# Patient Record
Sex: Male | Born: 1955 | Race: Black or African American | Hispanic: No | Marital: Single | Smoking: Former smoker
Health system: Southern US, Community
[De-identification: ages and names within clinical notes are randomized; demographics above are authoritative.]

## PROBLEM LIST (undated history)

## (undated) DIAGNOSIS — C61 Malignant neoplasm of prostate: Secondary | ICD-10-CM

## (undated) DIAGNOSIS — E7800 Pure hypercholesterolemia, unspecified: Secondary | ICD-10-CM

## (undated) DIAGNOSIS — I10 Essential (primary) hypertension: Secondary | ICD-10-CM

---

## 2019-07-17 DIAGNOSIS — U071 COVID-19: Secondary | ICD-10-CM

## 2019-07-17 HISTORY — DX: COVID-19: U07.1

## 2020-02-23 ENCOUNTER — Encounter: Payer: Self-pay | Admitting: *Deleted

## 2020-04-08 ENCOUNTER — Ambulatory Visit: Payer: Self-pay

## 2020-04-26 ENCOUNTER — Other Ambulatory Visit: Payer: Self-pay

## 2020-04-26 ENCOUNTER — Ambulatory Visit (INDEPENDENT_AMBULATORY_CARE_PROVIDER_SITE_OTHER): Payer: Self-pay | Admitting: *Deleted

## 2020-04-26 VITALS — Ht 67.0 in | Wt 174.0 lb

## 2020-04-26 DIAGNOSIS — Z86010 Personal history of colonic polyps: Secondary | ICD-10-CM

## 2020-04-26 DIAGNOSIS — Z1211 Encounter for screening for malignant neoplasm of colon: Secondary | ICD-10-CM

## 2020-04-26 MED ORDER — PEG 3350-KCL-NA BICARB-NACL 420 G PO SOLR: 4000.0000 mL | Freq: Once | ORAL | 0 refills | Status: AC

## 2020-04-26 NOTE — Progress Notes (Addendum)
Gastroenterology Pre-Procedure Review  Request Date: 04/26/2020 Requesting Physician: Dr. Sherrie Sport, Last TCS 06/28/2011 done by Dr. Britta Mccreedy, tubular adenoma, family hx of colon cancer (father)  PATIENT REVIEW QUESTIONS: The patient responded to the following health history questions as indicated:    1. Diabetes Melitis: no 2. Joint replacements in the past 12 months: no 3. Major health problems in the past 3 months: no 4. Has an artificial valve or MVP: no 5. Has a defibrillator: no 6. Has been advised in past to take antibiotics in advance of a procedure like teeth cleaning: no 7. Family history of colon cancer: yes, father: age 76  8. Alcohol Use: no 9. Illicit drug Use: no 10. History of sleep apnea: no  11. History of coronary artery or other vascular stents placed within the last 12 months: no 12. History of any prior anesthesia complications: no 13. Body mass index is 27.25 kg/m.    MEDICATIONS & ALLERGIES:    Patient reports the following regarding taking any blood thinners:   Plavix? no Aspirin? no Coumadin? no Brilinta? no Xarelto? no Eliquis? no Pradaxa? no Savaysa? no Effient? no  Patient confirms/reports the following medications:  Current Outpatient Medications  Medication Sig Dispense Refill  . acetaminophen (TYLENOL) 500 MG tablet Take 500 mg by mouth as needed. Takes 2 tablets as needed.    Marland Kitchen amLODipine (NORVASC) 10 MG tablet Take by mouth daily.    Marland Kitchen atorvastatin (LIPITOR) 40 MG tablet Take by mouth daily.    . montelukast (SINGULAIR) 10 MG tablet Take 10 mg by mouth daily.    . Multiple Vitamins-Minerals (ONE-A-DAY MENS 50+ PO) Take by mouth daily.    . Omega-3 Fatty Acids (FISH OIL) 1200 MG CAPS Take by mouth in the morning and at bedtime.     No current facility-administered medications for this visit.    Patient confirms/reports the following allergies:  Not on File  No orders of the defined types were placed in this encounter.   AUTHORIZATION  INFORMATION Primary Insurance: [place],  [place] #:[id number] ,  Group #: [id number] Pre-Cert / Auth required: No, file to local BCBS  SCHEDULE INFORMATION: Procedure has been scheduled as follows:  Date: 05/24/2020, Time: 10:30 Location: [facility] with Dr. Abbey Chatters  This Gastroenterology Pre-Precedure Review Form is being routed to the following provider(s): Aliene Altes, PA

## 2020-04-26 NOTE — Patient Instructions (Signed)
Logan Lowery   1955-12-25 MRN: [id number]    Procedure Date: 05/24/2020 Time to register: 9:00 am Place to register: [facility] Procedure Time: 10:30 am Scheduled provider: Dr. Abbey Chatters  PREPARATION FOR COLONOSCOPY WITH TRI-LYTE SPLIT PREP  Please notify us immediately if you are diabetic, take iron supplements, or if you are on Coumadin or any other blood thinners.   Please hold the following medications: n/a  You will need to purchase 1 fleet enema and 1 box of Bisacodyl 21m tablets.   2 DAYS BEFORE PROCEDURE:  DATE: 05/22/2020   DAY: Saturday Begin clear liquid diet AFTER your lunch meal. NO SOLID FOODS after this point.  1 DAY BEFORE PROCEDURE:  DATE: 05/23/2020   DAY: Sunday Continue clear liquids the entire day - NO SOLID FOOD.   Diabetic medications adjustments for today: n/a  At 2:00 pm:  Take 2 Bisacodyl tablets.   At 4:00pm:  Start drinking your solution. Make sure you mix well per instructions on the bottle. Try to drink 1 (one) 8 ounce glass every 10-15 minutes until you have consumed HALF the jug. You should complete by 6:00pm.You must keep the left over solution refrigerated until completed next day.  Continue clear liquids. You must drink plenty of clear liquids to prevent dehyration and kidney failure.     DAY OF PROCEDURE:   DATE: 05/24/2020   DAY: Monday If you take medications for your heart, blood pressure or breathing, you may take these medications.  Diabetic medications adjustments for today: n/a  Five hours before your procedure time @ 5:30 am:  Finish remaining amout of bowel prep, drinking 1 (one) 8 ounce glass every 10-15 minutes until complete. You have two hours to consume remaining prep.   Three hours before your procedure time @ 7:30 am:  Nothing by mouth.   At least one hour before going to the hospital:  Give yourself one Fleet enema. You may take your morning medications with sip of water unless we have instructed otherwise.       Please see below for Dietary Information.  CLEAR LIQUIDS INCLUDE:  Water Jello (NOT red in color)   Ice Popsicles (NOT red in color)   Tea (sugar ok, no milk/cream) Powdered fruit flavored drinks  Coffee (sugar ok, no milk/cream) Gatorade/ Lemonade/ Kool-Aid  (NOT red in color)   Juice: apple, white grape, white cranberry Soft drinks  Clear bullion, consomme, broth (fat free beef/chicken/vegetable)  Carbonated beverages (any kind)  Strained chicken noodle soup Hard Candy   Remember: Clear liquids are liquids that will allow you to see your fingers on the other side of a clear glass. Be sure liquids are NOT red in color, and not cloudy, but CLEAR.  DO NOT EAT OR DRINK ANY OF THE FOLLOWING:  Dairy products of any kind   Cranberry juice Tomato juice / V8 juice   Grapefruit juice Orange juice     Red grape juice  Do not eat any solid foods, including such foods as: cereal, oatmeal, yogurt, fruits, vegetables, creamed soups, eggs, bread, crackers, pureed foods in a blender, etc.   HELPFUL HINTS FOR DRINKING PREP SOLUTION:   Make sure prep is extremely cold. Mix and refrigerate the the morning of the prep. You may also put in the freezer.   You may try mixing some Crystal Light or Country Time Lemonade if you prefer. Mix in small amounts; add more if necessary.  Try drinking through a straw  Rinse mouth with water or  a mouthwash between glasses, to remove after-taste.  Try sipping on a cold beverage /ice/ popsicles between glasses of prep.  Place a piece of sugar-free hard candy in mouth between glasses.  If you become nauseated, try consuming smaller amounts, or stretch out the time between glasses. Stop for 30-60 minutes, then slowly start back drinking.        OTHER INSTRUCTIONS  You will need a responsible adult at least 64 years of age to accompany you and drive you home. This person must remain in the waiting room during your procedure. The hospital will cancel  your procedure if you do not have a responsible adult with you.   1. Wear loose fitting clothing that is easily removed. 2. Leave jewelry and other valuables at home.  3. Remove all body piercing jewelry and leave at home. 4. Total time from sign-in until discharge is approximately 2-3 hours. 5. You should go home directly after your procedure and rest. You can resume normal activities the day after your procedure. 6. The day of your procedure you should not:  Drive  Make legal decisions  Operate machinery  Drink alcohol  Return to work   You may call the office (Dept: [phone]) before 5:00pm, or page the doctor on call [phone]) after 5:00pm, for further instructions, if necessary.   Insurance Information YOU WILL NEED TO CHECK WITH YOUR INSURANCE COMPANY FOR THE BENEFITS OF COVERAGE YOU HAVE FOR THIS PROCEDURE.  UNFORTUNATELY, NOT ALL INSURANCE COMPANIES HAVE BENEFITS TO COVER ALL OR PART OF THESE TYPES OF PROCEDURES.  IT IS YOUR RESPONSIBILITY TO CHECK YOUR BENEFITS, HOWEVER, WE WILL BE GLAD TO ASSIST YOU WITH ANY CODES YOUR INSURANCE COMPANY MAY NEED.    PLEASE NOTE THAT MOST INSURANCE COMPANIES WILL NOT COVER A SCREENING COLONOSCOPY FOR PEOPLE UNDER THE AGE OF 50  IF YOU HAVE BCBS INSURANCE, YOU MAY HAVE BENEFITS FOR A SCREENING COLONOSCOPY BUT IF POLYPS ARE FOUND THE DIAGNOSIS WILL CHANGE AND THEN YOU MAY HAVE A DEDUCTIBLE THAT WILL NEED TO BE MET. SO PLEASE MAKE SURE YOU CHECK YOUR BENEFITS FOR A SCREENING COLONOSCOPY AS WELL AS A DIAGNOSTIC COLONOSCOPY.

## 2020-04-28 NOTE — Progress Notes (Signed)
Ok to schedule.  ASA II 

## 2020-05-20 ENCOUNTER — Encounter (HOSPITAL_COMMUNITY): Payer: Self-pay | Admitting: *Deleted

## 2020-05-21 ENCOUNTER — Other Ambulatory Visit (HOSPITAL_COMMUNITY)
Admission: RE | Admit: 2020-05-21 | Discharge: 2020-05-21 | Disposition: A | Discharge status: Home / Self Care | Payer: BC Managed Care – PPO | Source: Ambulatory Visit | Attending: Internal Medicine | Admitting: Internal Medicine

## 2020-05-21 ENCOUNTER — Other Ambulatory Visit: Payer: Self-pay

## 2020-05-21 DIAGNOSIS — Z20822 Contact with and (suspected) exposure to covid-19: Secondary | ICD-10-CM | POA: Diagnosis not present

## 2020-05-21 DIAGNOSIS — K648 Other hemorrhoids: Secondary | ICD-10-CM | POA: Diagnosis not present

## 2020-05-21 DIAGNOSIS — Z01812 Encounter for preprocedural laboratory examination: Secondary | ICD-10-CM | POA: Insufficient documentation

## 2020-05-21 DIAGNOSIS — Z1211 Encounter for screening for malignant neoplasm of colon: Secondary | ICD-10-CM | POA: Diagnosis not present

## 2020-05-21 DIAGNOSIS — D123 Benign neoplasm of transverse colon: Secondary | ICD-10-CM | POA: Diagnosis not present

## 2020-05-21 DIAGNOSIS — Z86010 Personal history of colonic polyps: Secondary | ICD-10-CM | POA: Diagnosis not present

## 2020-05-21 DIAGNOSIS — D122 Benign neoplasm of ascending colon: Secondary | ICD-10-CM | POA: Diagnosis not present

## 2020-05-21 DIAGNOSIS — Z800 Family history of malignant neoplasm of digestive organs: Secondary | ICD-10-CM | POA: Diagnosis not present

## 2020-05-21 DIAGNOSIS — E7800 Pure hypercholesterolemia, unspecified: Secondary | ICD-10-CM | POA: Diagnosis not present

## 2020-05-21 DIAGNOSIS — Z79899 Other long term (current) drug therapy: Secondary | ICD-10-CM | POA: Diagnosis not present

## 2020-05-21 DIAGNOSIS — I10 Essential (primary) hypertension: Secondary | ICD-10-CM | POA: Diagnosis not present

## 2020-05-22 LAB — SARS CORONAVIRUS 2 (TAT 6-24 HRS): SARS Coronavirus 2: NEGATIVE

## 2020-05-24 ENCOUNTER — Encounter (HOSPITAL_COMMUNITY)
Admission: RE | Disposition: A | Discharge status: Home / Self Care | Payer: Self-pay | Source: Home / Self Care | Attending: Internal Medicine

## 2020-05-24 ENCOUNTER — Ambulatory Visit (HOSPITAL_COMMUNITY): Payer: BC Managed Care – PPO | Admitting: Anesthesiology

## 2020-05-24 ENCOUNTER — Encounter (HOSPITAL_COMMUNITY): Payer: Self-pay

## 2020-05-24 ENCOUNTER — Other Ambulatory Visit: Payer: Self-pay

## 2020-05-24 ENCOUNTER — Ambulatory Visit (HOSPITAL_COMMUNITY)
Admission: RE | Admit: 2020-05-24 | Discharge: 2020-05-24 | Disposition: A | Discharge status: Home / Self Care | Payer: BC Managed Care – PPO | Attending: Internal Medicine | Admitting: Internal Medicine

## 2020-05-24 DIAGNOSIS — K635 Polyp of colon: Secondary | ICD-10-CM | POA: Diagnosis not present

## 2020-05-24 DIAGNOSIS — Z800 Family history of malignant neoplasm of digestive organs: Secondary | ICD-10-CM | POA: Insufficient documentation

## 2020-05-24 DIAGNOSIS — Z86010 Personal history of colonic polyps: Secondary | ICD-10-CM | POA: Insufficient documentation

## 2020-05-24 DIAGNOSIS — E7800 Pure hypercholesterolemia, unspecified: Secondary | ICD-10-CM | POA: Insufficient documentation

## 2020-05-24 DIAGNOSIS — Z20822 Contact with and (suspected) exposure to covid-19: Secondary | ICD-10-CM | POA: Insufficient documentation

## 2020-05-24 DIAGNOSIS — I10 Essential (primary) hypertension: Secondary | ICD-10-CM | POA: Insufficient documentation

## 2020-05-24 DIAGNOSIS — Z79899 Other long term (current) drug therapy: Secondary | ICD-10-CM | POA: Insufficient documentation

## 2020-05-24 DIAGNOSIS — D123 Benign neoplasm of transverse colon: Secondary | ICD-10-CM | POA: Insufficient documentation

## 2020-05-24 DIAGNOSIS — Z1211 Encounter for screening for malignant neoplasm of colon: Secondary | ICD-10-CM | POA: Diagnosis not present

## 2020-05-24 DIAGNOSIS — D122 Benign neoplasm of ascending colon: Secondary | ICD-10-CM | POA: Insufficient documentation

## 2020-05-24 DIAGNOSIS — K648 Other hemorrhoids: Secondary | ICD-10-CM | POA: Insufficient documentation

## 2020-05-24 HISTORY — PX: POLYPECTOMY: SHX5525

## 2020-05-24 HISTORY — DX: Pure hypercholesterolemia, unspecified: E78.00

## 2020-05-24 HISTORY — DX: Essential (primary) hypertension: I10

## 2020-05-24 HISTORY — PX: COLONOSCOPY WITH PROPOFOL: SHX5780

## 2020-05-24 SURGERY — COLONOSCOPY WITH PROPOFOL
Anesthesia: General

## 2020-05-24 MED ORDER — CHLORHEXIDINE GLUCONATE CLOTH 2 % EX PADS: 6.0000 | MEDICATED_PAD | Freq: Once | CUTANEOUS | Status: DC

## 2020-05-24 MED ORDER — PROPOFOL 500 MG/50ML IV EMUL
INTRAVENOUS | Status: DC | PRN
  Administered 2020-05-24: 150 ug/kg/min via INTRAVENOUS

## 2020-05-24 MED ORDER — PROPOFOL 10 MG/ML IV BOLUS
INTRAVENOUS | Status: DC | PRN
  Administered 2020-05-24: 100 mg via INTRAVENOUS

## 2020-05-24 MED ORDER — LACTATED RINGERS IV SOLN: Freq: Once | INTRAVENOUS | Status: AC

## 2020-05-24 MED ORDER — LIDOCAINE HCL (CARDIAC) PF 100 MG/5ML IV SOSY
PREFILLED_SYRINGE | INTRAVENOUS | Status: DC | PRN
  Administered 2020-05-24: 100 mg via INTRAVENOUS

## 2020-05-24 MED ORDER — LACTATED RINGERS IV SOLN: INTRAVENOUS | Status: DC | PRN

## 2020-05-24 MED ORDER — STERILE WATER FOR IRRIGATION IR SOLN
Status: DC | PRN
  Administered 2020-05-24: 1.5 mL

## 2020-05-24 NOTE — Discharge Instructions (Addendum)
Colonoscopy Discharge Instructions  Read the instructions outlined below and refer to this sheet in the next few weeks. These discharge instructions provide you with general information on caring for yourself after you leave the hospital. Your doctor may also give you specific instructions. While your treatment has been planned according to the most current medical practices available, unavoidable complications occasionally occur.   ACTIVITY  You may resume your regular activity, but move at a slower pace for the next 24 hours.   Take frequent rest periods for the next 24 hours.   Walking will help get rid of the air and reduce the bloated feeling in your belly (abdomen).   No driving for 24 hours (because of the medicine (anesthesia) used during the test).    Do not sign any important legal documents or operate any machinery for 24 hours (because of the anesthesia used during the test).  NUTRITION  Drink plenty of fluids.   You may resume your normal diet as instructed by your doctor.   Begin with a light meal and progress to your normal diet. Heavy or fried foods are harder to digest and may make you feel sick to your stomach (nauseated).   Avoid alcoholic beverages for 24 hours or as instructed.  MEDICATIONS  You may resume your normal medications unless your doctor tells you otherwise.  WHAT YOU CAN EXPECT TODAY  Some feelings of bloating in the abdomen.   Passage of more gas than usual.   Spotting of blood in your stool or on the toilet paper.  IF YOU HAD POLYPS REMOVED DURING THE COLONOSCOPY:  No aspirin products for 7 days or as instructed.   No alcohol for 7 days or as instructed.   Eat a soft diet for the next 24 hours.  FINDING OUT THE RESULTS OF YOUR TEST Not all test results are available during your visit. If your test results are not back during the visit, make an appointment with your caregiver to find out the results. Do not assume everything is normal if  you have not heard from your caregiver or the medical facility. It is important for you to follow up on all of your test results.  SEEK IMMEDIATE MEDICAL ATTENTION IF:  You have more than a spotting of blood in your stool.   Your belly is swollen (abdominal distention).   You are nauseated or vomiting.   You have a temperature over 101.   You have abdominal pain or discomfort that is severe or gets worse throughout the day.   Your colonoscopy revealed 4 polyp(s) which I removed successfully. Await pathology results, my office will contact you. I recommend repeating colonoscopy in 5 years for surveillance purposes. Follow up with GI as needed.    I hope you have a great rest of your week!  Elon Alas. Abbey Chatters, D.O. Gastroenterology and Hepatology [facility]   Colon Polyps  Polyps are tissue growths inside the body. Polyps can grow in many places, including the large intestine (colon). A polyp may be a round bump or a mushroom-shaped growth. You could have one polyp or several. Most colon polyps are noncancerous (benign). However, some colon polyps can become cancerous over time. Finding and removing the polyps early can help prevent this. What are the causes? The exact cause of colon polyps is not known. What increases the risk? You are more likely to develop this condition if you:  Have a family history of colon cancer or colon polyps.  Are  older than 68 or older than 1 if you are African American.  Have inflammatory bowel disease, such as ulcerative colitis or Crohn's disease.  Have certain hereditary conditions, such as: ? Familial adenomatous polyposis. ? Lynch syndrome. ? Turcot syndrome. ? Peutz-Jeghers syndrome.  Are overweight.  Smoke cigarettes.  Do not get enough exercise.  Drink too much alcohol.  Eat a diet that is high in fat and red meat and low in fiber.  Had childhood cancer that was treated with abdominal  radiation. What are the signs or symptoms? Most polyps do not cause symptoms. If you have symptoms, they may include:  Blood coming from your rectum when having a bowel movement.  Blood in your stool. The stool may look dark red or black.  Abdominal pain.  A change in bowel habits, such as constipation or diarrhea. How is this diagnosed? This condition is diagnosed with a colonoscopy. This is a procedure in which a lighted, flexible scope is inserted into the anus and then passed into the colon to examine the area. Polyps are sometimes found when a colonoscopy is done as part of routine cancer screening tests. How is this treated? Treatment for this condition involves removing any polyps that are found. Most polyps can be removed during a colonoscopy. Those polyps will then be tested for cancer. Additional treatment may be needed depending on the results of testing. Follow these instructions at home: Lifestyle  Maintain a healthy weight, or lose weight if recommended by your health care provider.  Exercise every day or as told by your health care provider.  Do not use any products that contain nicotine or tobacco, such as cigarettes and e-cigarettes. If you need help quitting, ask your health care provider.  If you drink alcohol, limit how much you have: ? 0-1 drink a day for women. ? 0-2 drinks a day for men.  Be aware of how much alcohol is in your drink. In the [place] one drink equals one 12 oz bottle of beer (355 mL), one 5 oz glass of wine (148 mL), or one 1 oz shot of hard liquor (44 mL). Eating and drinking   Eat foods that are high in fiber, such as fruits, vegetables, and whole grains.  Eat foods that are high in calcium and vitamin D, such as milk, cheese, yogurt, eggs, liver, fish, and broccoli.  Limit foods that are high in fat, such as fried foods and desserts.  Limit the amount of red meat and processed meat you eat, such as hot dogs, sausage, bacon, and lunch  meats. General instructions  Keep all follow-up visits as told by your health care provider. This is important. ? This includes having regularly scheduled colonoscopies. ? Talk to your health care provider about when you need a colonoscopy. Contact a health care provider if:  You have new or worsening bleeding during a bowel movement.  You have new or increased blood in your stool.  You have a change in bowel habits.  You lose weight for no known reason. Summary  Polyps are tissue growths inside the body. Polyps can grow in many places, including the colon.  Most colon polyps are noncancerous (benign), but some can become cancerous over time.  This condition is diagnosed with a colonoscopy.  Treatment for this condition involves removing any polyps that are found. Most polyps can be removed during a colonoscopy. This information is not intended to replace advice given to you by your health care provider. Make sure you  discuss any questions you have with your health care provider. Document Revised: 10/11/2017 Document Reviewed: 10/11/2017 Elsevier Patient Education  [place].

## 2020-05-24 NOTE — Anesthesia Postprocedure Evaluation (Signed)
Anesthesia Post Note  Patient: Logan Lowery  Procedure(s) Performed: COLONOSCOPY WITH PROPOFOL (N/A ) POLYPECTOMY  Patient location during evaluation: Phase II Anesthesia Type: General Level of consciousness: awake, oriented and awake and alert Pain management: satisfactory to patient Vital Signs Assessment: post-procedure vital signs reviewed and stable Respiratory status: spontaneous breathing, respiratory function stable and nonlabored ventilation Cardiovascular status: stable Postop Assessment: no apparent nausea or vomiting Anesthetic complications: no   No complications documented.   Last Vitals:  Vitals:   05/24/20 0919  BP: (!) 145/102  Pulse: 77  Resp: 16  Temp: 36.5 C  SpO2: 98%    Last Pain:  Vitals:   05/24/20 1041  TempSrc:   PainSc: 0-No pain                 Jasmyn Picha

## 2020-05-24 NOTE — Op Note (Signed)
[facility] Patient Name: Logan Lowery Procedure Date: 05/24/2020 10:11 AM MRN: [id number] Date of Birth: 1956-04-14 Attending MD: Elon Alas. Edgar Frisk CSN: [id number] Age: 64 Admit Type: Outpatient Procedure:                Colonoscopy Indications:              High risk colon cancer surveillance: Personal                            history of colonic polyps Providers:                Elon Alas. Abbey Chatters, DO, Caprice Kluver, Aram Candela Referring MD:              Medicines:                See the Anesthesia note for documentation of the                            administered medications Complications:            No immediate complications. Estimated Blood Loss:     Estimated blood loss was minimal. Procedure:                Pre-Anesthesia Assessment:                           - The anesthesia plan was to use monitored                            anesthesia care (MAC).                           After obtaining informed consent, the colonoscope                            was passed under direct vision. Throughout the                            procedure, the patient's blood pressure, pulse, and                            oxygen saturations were monitored continuously. The                            [id number]) was introduced through the anus                            and advanced to the the cecum, identified by                            appendiceal orifice and ileocecal valve. The                            colonoscopy was performed without difficulty. The                            patient tolerated the procedure well.  The quality                            of the bowel preparation was evaluated using the                            BBPS [facility] Bowel Preparation Scale) with scores                            of: Right Colon = 3, Transverse Colon = 3 and Left                            Colon = 3 (entire mucosa seen well with no residual                             staining, small fragments of stool or opaque                            liquid). The total BBPS score equals 9. Scope In: 10:46:42 AM Scope Out: 10:59:55 AM Scope Withdrawal Time: 0 hours 9 minutes 44 seconds  Total Procedure Duration: 0 hours 13 minutes 13 seconds  Findings:      The perianal and digital rectal examinations were normal.      Non-bleeding internal hemorrhoids were found during endoscopy.      Three sessile polyps were found in the transverse colon and ascending       colon. The polyps were 1 to 2 mm in size. These polyps were removed with       a jumbo cold forceps. Resection and retrieval were complete.      A 4 mm polyp was found in the transverse colon. The polyp was sessile.       The polyp was removed with a cold snare. Resection and retrieval were       complete.      The exam was otherwise without abnormality. Impression:               - Non-bleeding internal hemorrhoids.                           - Three 1 to 2 mm polyps in the transverse colon                            and in the ascending colon, removed with a jumbo                            cold forceps. Resected and retrieved.                           - One 4 mm polyp in the transverse colon, removed                            with a cold snare. Resected and retrieved.                           - The examination was  otherwise normal. Moderate Sedation:      Per Anesthesia Care Recommendation:           - Patient has a contact number available for                            emergencies. The signs and symptoms of potential                            delayed complications were discussed with the                            patient. Return to normal activities tomorrow.                            Written discharge instructions were provided to the                            patient.                           - Resume previous diet.                           - Continue present medications.                            - Await pathology results.                           - Repeat colonoscopy in 5 years for surveillance.                           - Return to GI clinic PRN. Procedure Code(s):        --- Professional ---                           [phone], Colonoscopy, flexible; with removal of                            tumor(s), polyp(s), or other lesion(s) by snare                            technique                           [id number], 34, Colonoscopy, flexible; with biopsy,                            single or multiple Diagnosis Code(s):        --- Professional ---                           K63.5, Polyp of colon                           Z86.010, Personal history of colonic polyps  K64.8, Other hemorrhoids CPT copyright 2019 American Medical Association. All rights reserved. The codes documented in this report are preliminary and upon coder review may  be revised to meet current compliance requirements. Elon Alas. Abbey Chatters, DO [place] Abbey Chatters, DO 05/24/2020 11:02:18 AM This report has been signed electronically. Number of Addenda: 0

## 2020-05-24 NOTE — Anesthesia Preprocedure Evaluation (Addendum)
Anesthesia Evaluation  Patient identified by MRN, date of birth, ID band Patient awake    Reviewed: Allergy & Precautions, NPO status , Patient's Chart, lab work & pertinent test results  History of Anesthesia Complications Negative for: history of anesthetic complications  Airway Mallampati: II  TM Distance: >3 FB Neck ROM: Full    Dental no notable dental hx. (+) Dental Advisory Given, Loose, Missing, Upper Dentures,    Pulmonary neg pulmonary ROS,    Pulmonary exam normal breath sounds clear to auscultation       Cardiovascular Exercise Tolerance: Good hypertension, Pt. on medications Normal cardiovascular exam Rhythm:Regular Rate:Normal     Neuro/Psych negative neurological ROS  negative psych ROS   GI/Hepatic negative GI ROS, Neg liver ROS,   Endo/Other  negative endocrine ROS  Renal/GU negative Renal ROS     Musculoskeletal negative musculoskeletal ROS (+)   Abdominal   Peds  Hematology negative hematology ROS (+)   Anesthesia Other Findings   Reproductive/Obstetrics                            Anesthesia Physical Anesthesia Plan  ASA: II  Anesthesia Plan: General   Post-op Pain Management:    Induction: Intravenous  PONV Risk Score and Plan: TIVA  Airway Management Planned: Nasal Cannula, Natural Airway and Simple Face Mask  Additional Equipment:   Intra-op Plan:   Post-operative Plan:   Informed Consent: I have reviewed the patients History and Physical, chart, labs and discussed the procedure including the risks, benefits and alternatives for the proposed anesthesia with the patient or authorized representative who has indicated his/her understanding and acceptance.     Dental advisory given  Plan Discussed with: CRNA and Surgeon  Anesthesia Plan Comments:        Anesthesia Quick Evaluation

## 2020-05-24 NOTE — H&P (Signed)
Primary Care Physician:  Neale Burly, MD Primary Gastroenterologist:  Dr. Abbey Chatters  Pre-Procedure History & Physical: HPI:  Logan Lowery is a 64 y.o. male is here for a colonoscopy to be performed for surveillance purposes due to personal history of adenomatous colon polyps as well as family history of colon cancer in father, . Patient denies melena or hematochezia.  No abdominal pain or unintentional weight loss.  No change in bowel habits.  Overall feels well from a GI standpoint.  Past Medical History:  Diagnosis Date  . Hypercholesteremia   . Hypertension     History reviewed. No pertinent surgical history.  Prior to Admission medications   Medication Sig Start Date End Date Taking? Authorizing Provider  acetaminophen (TYLENOL) 500 MG tablet Take 1,000 mg by mouth every 6 (six) hours as needed for moderate pain.    Yes [provider]  amLODipine (NORVASC) 10 MG tablet Take 10 mg by mouth daily.  06/21/19  Yes [provider]  atorvastatin (LIPITOR) 40 MG tablet Take 40 mg by mouth daily.  06/21/19  Yes [provider]  montelukast (SINGULAIR) 10 MG tablet Take 10 mg by mouth daily. 03/22/20  Yes [provider]  Multiple Vitamins-Minerals (ONE-A-DAY MENS 50+ PO) Take 1 tablet by mouth daily.    Yes [provider]  Omega-3 Fatty Acids (FISH OIL) 1200 MG CAPS Take 1,200 mg by mouth daily.    Yes [provider]    Allergies as of 04/28/2020  . (Not on File)    History reviewed. No pertinent family history.  Social History   Socioeconomic History  . Marital status: Unknown    Spouse name: Not on file  . Number of children: Not on file  . Years of education: Not on file  . Highest education level: Not on file  Occupational History  . Not on file  Tobacco Use  . Smoking status: Never Smoker  . Smokeless tobacco: Never Used  Substance and Sexual Activity  . Alcohol use: Not on file  . Drug use: Not on file  .  Sexual activity: Not on file  Other Topics Concern  . Not on file  Social History Narrative  . Not on file   Social Determinants of Health   Financial Resource Strain:   . Difficulty of Paying Living Expenses: Not on file  Food Insecurity:   . Worried About Charity fundraiser in the Last Year: Not on file  . Ran Out of Food in the Last Year: Not on file  Transportation Needs:   . Lack of Transportation (Medical): Not on file  . Lack of Transportation (Non-Medical): Not on file  Physical Activity:   . Days of Exercise per Week: Not on file  . Minutes of Exercise per Session: Not on file  Stress:   . Feeling of Stress : Not on file  Social Connections:   . Frequency of Communication with Friends and Family: Not on file  . Frequency of Social Gatherings with Friends and Family: Not on file  . Attends Religious Services: Not on file  . Active Member of Clubs or Organizations: Not on file  . Attends Archivist Meetings: Not on file  . Marital Status: Not on file  Intimate Partner Violence:   . Fear of Current or Ex-Partner: Not on file  . Emotionally Abused: Not on file  . Physically Abused: Not on file  . Sexually Abused: Not on file    Review of  Systems: See HPI, otherwise negative ROS  Impression/Plan: [name] is here for a colonoscopy to be performed for surveillance purposes due to personal history of adenomatous colon polyps as well as family history of colon cancer in father,   The risks of the procedure including infection, bleed, or perforation as well as benefits, limitations, alternatives and imponderables have been reviewed with the patient. Questions have been answered. All parties agreeable.

## 2020-05-24 NOTE — Transfer of Care (Signed)
Immediate Anesthesia Transfer of Care Note  Patient: Logan Lowery  Procedure(s) Performed: COLONOSCOPY WITH PROPOFOL (N/A ) POLYPECTOMY  Patient Location: PACU  Anesthesia Type:General  Level of Consciousness: awake, alert , oriented and patient cooperative  Airway & Oxygen Therapy: Patient Spontanous Breathing  Post-op Assessment: Report given to RN, Post -op Vital signs reviewed and stable and Patient moving all extremities X 4  Post vital signs: Reviewed and stable  Last Vitals:  Vitals Value Taken Time  BP    Temp    Pulse    Resp    SpO2      Last Pain:  Vitals:   05/24/20 1041  TempSrc:   PainSc: 0-No pain         Complications: No complications documented.

## 2020-05-25 LAB — SURGICAL PATHOLOGY

## 2020-05-31 ENCOUNTER — Encounter (HOSPITAL_COMMUNITY): Payer: Self-pay | Admitting: Internal Medicine

## 2020-07-20 ENCOUNTER — Encounter: Payer: Self-pay | Admitting: Urology

## 2020-07-20 ENCOUNTER — Other Ambulatory Visit: Payer: Self-pay

## 2020-07-20 ENCOUNTER — Ambulatory Visit (INDEPENDENT_AMBULATORY_CARE_PROVIDER_SITE_OTHER): Payer: BC Managed Care – PPO | Admitting: Urology

## 2020-07-20 VITALS — BP 173/105 | HR 106 | Temp 98.5°F | Ht 67.0 in | Wt 173.0 lb

## 2020-07-20 DIAGNOSIS — N401 Enlarged prostate with lower urinary tract symptoms: Secondary | ICD-10-CM

## 2020-07-20 DIAGNOSIS — N138 Other obstructive and reflux uropathy: Secondary | ICD-10-CM

## 2020-07-20 DIAGNOSIS — R972 Elevated prostate specific antigen [PSA]: Secondary | ICD-10-CM | POA: Diagnosis not present

## 2020-07-20 LAB — MICROSCOPIC EXAMINATION
Bacteria, UA: NONE SEEN
Epithelial Cells (non renal): NONE SEEN /hpf (ref 0–10)
Renal Epithel, UA: NONE SEEN /hpf
WBC, UA: NONE SEEN /hpf (ref 0–5)

## 2020-07-20 LAB — URINALYSIS, ROUTINE W REFLEX MICROSCOPIC
Bilirubin, UA: NEGATIVE
Glucose, UA: NEGATIVE
Ketones, UA: NEGATIVE
Leukocytes,UA: NEGATIVE
Nitrite, UA: NEGATIVE
Specific Gravity, UA: 1.02 (ref 1.005–1.030)
Urobilinogen, Ur: 0.2 mg/dL (ref 0.2–1.0)
pH, UA: 7 (ref 5.0–7.5)

## 2020-07-20 NOTE — Progress Notes (Signed)
Urological Symptom Review  Patient is experiencing the following symptoms: Frequent urination Get up at night to urinate Erection problems (male only)   Review of Systems  Gastrointestinal (upper)  : Negative for upper GI symptoms  Gastrointestinal (lower) : Negative for lower GI symptoms  Constitutional : Negative for symptoms  Skin: Negative for skin symptoms  Eyes: Negative for eye symptoms  Ear/Nose/Throat : Negative for Ear/Nose/Throat symptoms  Hematologic/Lymphatic: Negative for Hematologic/Lymphatic symptoms  Cardiovascular : Negative for cardiovascular symptoms  Respiratory : Sinus problems  Endocrine: Negative for endocrine symptoms  Musculoskeletal: Negative for musculoskeletal symptoms  Neurological: Negative for neurological symptoms  Psychologic: Negative for psychiatric symptoms

## 2020-07-20 NOTE — Progress Notes (Signed)
H&P  Chief Complaint: Elevated PSA  History of Present Illness:   1.11.2022: New pt here for referral of Elevated PSA. Pt denies any history of prostate disease/infection. He denies experiencing significant LUTS and reports a good FOS but sometimes feels he is unable to empty his bladder completely. He reports that he experiences ED for which he takes sildenafil.  Referred by Dr Sherrie Sport  IPSS Questionnaire (AUA-7): Over the past month.   1)  How often have you had a sensation of not emptying your bladder completely after you finish urinating?  2 - Less than half the time  2)  How often have you had to urinate again less than two hours after you finished urinating? 2 - Less than half the time  3)  How often have you found you stopped and started again several times when you urinated?  1 - Less than 1 time in 5  4) How difficult have you found it to postpone urination?  2 - Less than half the time  5) How often have you had a weak urinary stream?  1 - Less than 1 time in 5  6) How often have you had to push or strain to begin urination?  1 - Less than 1 time in 5  7) How many times did you most typically get up to urinate from the time you went to bed until the time you got up in the morning?  2 - 2 times  Total score:  0-7 mildly symptomatic   8-19 moderately symptomatic   20-35 severely symptomatic  IPSS: 1 QoL Score: 2   Past Medical History:  Diagnosis Date  . Hypercholesteremia   . Hypertension     Past Surgical History:  Procedure Laterality Date  . COLONOSCOPY WITH PROPOFOL N/A 05/24/2020   Procedure: COLONOSCOPY WITH PROPOFOL;  Surgeon: Eloise Harman, DO;  Location: [facility];  Service: Endoscopy;  Laterality: N/A;  Surveillance-10:45am  . POLYPECTOMY  05/24/2020   Procedure: POLYPECTOMY;  Surgeon: Eloise Harman, DO;  Location: [facility];  Service: Endoscopy;;    Home Medications:  Allergies as of 07/20/2020   No Known Allergies     Medication List        Accurate as of July 20, 2020  9:47 AM. If you have any questions, ask your nurse or doctor.        acetaminophen 500 MG tablet Commonly known as: TYLENOL Take 1,000 mg by mouth every 6 (six) hours as needed for moderate pain.   amLODipine 10 MG tablet Commonly known as: NORVASC Take 10 mg by mouth daily.   atorvastatin 40 MG tablet Commonly known as: LIPITOR Take 40 mg by mouth daily.   Fish Oil 1200 MG Caps Take 1,200 mg by mouth daily.   montelukast 10 MG tablet Commonly known as: SINGULAIR Take 10 mg by mouth daily.   ONE-A-DAY MENS 50+ PO Take 1 tablet by mouth daily.       Allergies: No Known Allergies  No family history on file.  Social History:  reports that he has never smoked. He has never used smokeless tobacco. No history on file for alcohol use and drug use.  ROS: A complete review of systems was performed.  All systems are negative except for pertinent findings as noted.  Physical Exam:  Vital signs in last 24 hours: There were no vitals taken for this visit. Constitutional:  Alert and oriented, No acute distress Cardiovascular: Regular rate  Respiratory: Normal respiratory  effort GI: Abdomen is soft, nontender, nondistended, no abdominal masses. No CVAT. No hernias. Genitourinary: Normal Uncircumcised male phallus, testes are descended bilaterally and non-tender and without masses, scrotum is normal in appearance without lesions or masses, perineum is normal on inspection. Prostate feels about 60 grams. Neurologic: Grossly intact, no focal deficits Psychiatric: Normal mood and affect  I have reviewed prior pt notes  I have reviewed notes from referring/previous physicians (no notes from provider)  I have reviewed urinalysis results    Impression/Assessment:  Elevated PSA - DRE shows prostate is about 60 grams but otherwise unremarkable. Pt PSA pending for today.  Plan:  1. Pt reassured abd oriented to PSA monitoring and  significance.  2. F/U depending on pt PSA results.  3. We willl get old PSA data from PCP  CC: Dr. Stoney Bang

## 2020-07-21 ENCOUNTER — Other Ambulatory Visit: Payer: Self-pay | Admitting: Urology

## 2020-07-21 DIAGNOSIS — R972 Elevated prostate specific antigen [PSA]: Secondary | ICD-10-CM

## 2020-07-21 LAB — FPSA% REFLEX
% FREE PSA: 11.2 %
PSA, FREE: 0.67 ng/mL

## 2020-07-21 LAB — PSA TOTAL (REFLEX TO FREE): Prostate Specific Ag, Serum: 6 ng/mL — ABNORMAL HIGH (ref 0.0–4.0)

## 2020-07-21 MED ORDER — LEVOFLOXACIN 750 MG PO TABS: 750.0000 mg | ORAL_TABLET | Freq: Every day | ORAL | 0 refills | Status: AC

## 2020-07-28 ENCOUNTER — Telehealth: Payer: Self-pay

## 2020-07-28 NOTE — Telephone Encounter (Signed)
-----   Message from Franchot Gallo, MD sent at 07/21/2020  8:27 PM EST ----- Notify pt--psa still elevated--6.0. I would recommend TRUS/Bx--I put orders in ----- Message ----- From: Dorisann Frames, RN Sent: 07/21/2020   8:35 AM EST To: Franchot Gallo, MD  Please review

## 2020-07-28 NOTE — Telephone Encounter (Signed)
Left message to return call 

## 2020-08-25 DIAGNOSIS — R972 Elevated prostate specific antigen [PSA]: Secondary | ICD-10-CM

## 2020-08-25 MED ORDER — LEVOFLOXACIN 750 MG PO TABS: 750.0000 mg | ORAL_TABLET | Freq: Every day | ORAL | 0 refills | Status: DC

## 2020-08-25 NOTE — Progress Notes (Signed)
Biopsy instructions went over with patient via phone and sent via mail.

## 2020-08-25 NOTE — Telephone Encounter (Signed)
Patient called with no answer. Message left to return call to office. 

## 2020-08-25 NOTE — Telephone Encounter (Signed)
Patient returned called and made aware of elevated psa and need of prostate biopsy. Biopsy scheduled , orders  placed, and antibiotic sent in. Instructions went over verbally with patient and sent via mail.

## 2020-09-27 NOTE — Progress Notes (Signed)
This man is here today for ultrasound and biopsy of the prostate.  He has elevated PSA.  Risks, benefits, and some of the potential complications of a transrectal ultrasounds of the prostate (TRUSP) with biopsies were discussed at length with the patient including gross hematuria, blood in the bowel movements, hematospermia, bacteremia, infection, voiding discomfort, urinary retention, fever, chills, sepsis, blood transfusion, death, and others. All questions were answered. Informed consent was obtained. The patient confirmed that he had taken his pre-procedure antibiotic. All anticoagulants were discontinued prior to the procedure. The patient emptied his bladder. He was positioned in a comfortable left lateral decubitus position with hips and knees acutely flexed.  The rectal probe was inserted into the rectum without difficulty. 10cc of 2% Lidocaine without epinephrine was instilled with a spinal needle using ultrasound guidance near the junction of each seminal vesicle and the prostate.  Sequential transverse (axial) scans were made in small increments beginning at the seminal vesicles and ending at the prostatic apex. Sequential longitudinal (saggital) scans were made in small increments beginning at the right lateral prostate and ending at the left lateral prostate. Excellent anatomical imaging was obtained. The peripheral, transitional, and central zones were well-defined. The seminal vesicles were normal.  Prostate volume 31 ml.  There were no hypoechoic areas. 12 needle core biopsies were performed. 1 biopsy each was taken from the following areas:  Right lateral base, right medial base, right lateral mid prostate, right medial mid prostate, right lateral apical prostate, right medial apical prostate, left lateral base, left medial base, left lateral mid prostate, left medial mid prostate, left lateral apical prostate, left medial apical prostate.. Minimal prostatic calcifications were noted.  Excellent biopsy specimens were obtained.  Follow-up rectal examination was unremarkable. The procedure was well-tolerated and without complications. Antibiotic instructions were given. The patient was told that:  For several days:  he should increase his fluid intake and limit strenuous activity  he might have mild discomfort at the base of his penis or in his rectum  he might have blood in his urine or blood in his bowel movements  For 2-3 months:  he might have blood in his ejaculate (semen)  Instructions were given to call the office immedicately for blood clots in the urine or bowel movements, difficulty urinating, inability to urinate, urinary retention, painful or frequent urination, fever, chills, nausea, vomiting, or other illness. The patient stated that he understood these instructions and would comply with them. We told the patient that prostate biopsy pathology reports are usually available within 3-5 working days, unless a pathologic second opinion is required, which may take 7-14 days. We told him to contact us to check on the status of his biopsy if he has not heard from [place] within 7 days. The patient left the ultrasound examination room in stable condition.

## 2020-09-28 ENCOUNTER — Encounter (HOSPITAL_COMMUNITY): Payer: Self-pay

## 2020-09-28 ENCOUNTER — Ambulatory Visit (INDEPENDENT_AMBULATORY_CARE_PROVIDER_SITE_OTHER): Payer: BC Managed Care – PPO | Admitting: Urology

## 2020-09-28 ENCOUNTER — Other Ambulatory Visit: Payer: Self-pay

## 2020-09-28 ENCOUNTER — Encounter: Payer: Self-pay | Admitting: Urology

## 2020-09-28 ENCOUNTER — Ambulatory Visit (HOSPITAL_COMMUNITY)
Admission: RE | Admit: 2020-09-28 | Discharge: 2020-09-28 | Disposition: A | Discharge status: Home / Self Care | Payer: BC Managed Care – PPO | Source: Ambulatory Visit | Attending: Urology | Admitting: Urology

## 2020-09-28 ENCOUNTER — Other Ambulatory Visit: Payer: Self-pay | Admitting: Urology

## 2020-09-28 DIAGNOSIS — R972 Elevated prostate specific antigen [PSA]: Secondary | ICD-10-CM

## 2020-09-28 DIAGNOSIS — C61 Malignant neoplasm of prostate: Secondary | ICD-10-CM | POA: Insufficient documentation

## 2020-09-28 MED ORDER — LIDOCAINE HCL (PF) 2 % IJ SOLN: 10.0000 mL | Freq: Once | INTRAMUSCULAR | Status: AC

## 2020-09-28 MED ORDER — GENTAMICIN SULFATE 40 MG/ML IJ SOLN: 160.0000 mg | Freq: Once | INTRAMUSCULAR | Status: AC

## 2020-09-28 MED ORDER — LIDOCAINE HCL (PF) 2 % IJ SOLN
INTRAMUSCULAR | Status: AC
  Administered 2020-09-28: 10 mL
  Filled 2020-09-28: qty 10

## 2020-09-28 MED ORDER — GENTAMICIN SULFATE 40 MG/ML IJ SOLN
INTRAMUSCULAR | Status: AC
  Administered 2020-09-28: 160 mg via INTRAMUSCULAR
  Filled 2020-09-28: qty 4

## 2020-09-28 NOTE — Sedation Documentation (Signed)
PT tolerated prostate biopsy procedure and IM injections of antibiotic well today. Labs obtained and sent for pathology. PT ambulatory at discharge with no acute distress noted and verbalized understanding of discharge instructions. PT to follow up with urologist as scheduled.

## 2020-09-28 NOTE — Discharge Instructions (Signed)

## 2020-10-05 ENCOUNTER — Telehealth: Payer: Self-pay

## 2020-10-05 NOTE — Progress Notes (Signed)
Attempted to call pt to give him new appointment date.

## 2020-10-05 NOTE — Telephone Encounter (Signed)
-----   Message from Franchot Gallo, MD sent at 10/05/2020  8:11 AM EDT ----- I ncalled w/ results. Please set up PCa conference ----- Message ----- From: Dorisann Frames, RN Sent: 10/01/2020  10:45 AM EDT To: Franchot Gallo, MD  Please review

## 2020-10-05 NOTE — Telephone Encounter (Signed)
Pt notified of new appointment.

## 2020-10-12 ENCOUNTER — Ambulatory Visit (INDEPENDENT_AMBULATORY_CARE_PROVIDER_SITE_OTHER): Payer: BC Managed Care – PPO | Admitting: Urology

## 2020-10-12 ENCOUNTER — Other Ambulatory Visit: Payer: Self-pay

## 2020-10-12 ENCOUNTER — Encounter: Payer: Self-pay | Admitting: Urology

## 2020-10-12 VITALS — BP 150/97 | HR 111 | Temp 98.5°F | Ht 67.0 in | Wt 180.0 lb

## 2020-10-12 DIAGNOSIS — C61 Malignant neoplasm of prostate: Secondary | ICD-10-CM | POA: Diagnosis not present

## 2020-10-12 NOTE — Progress Notes (Signed)
History of Present Illness: Pt here for PCa conference.  TRUS/Bx on 3.22.2022. PSA 6  Prostate volume 31 mL, PSAD 0.19. 5 cores revealed GG2 PCA- Lt apex lateral (5% involvement), Rt apex lateral (70%), Lt apex medial (5%), Lt mid lateral (20%), Lt mid medial (5%)  Past Medical History:  Diagnosis Date  . Hypercholesteremia   . Hypertension     Past Surgical History:  Procedure Laterality Date  . COLONOSCOPY WITH PROPOFOL N/A 05/24/2020   Procedure: COLONOSCOPY WITH PROPOFOL;  Surgeon: Eloise Harman, DO;  Location: [facility];  Service: Endoscopy;  Laterality: N/A;  Surveillance-10:45am  . POLYPECTOMY  05/24/2020   Procedure: POLYPECTOMY;  Surgeon: Eloise Harman, DO;  Location: [facility];  Service: Endoscopy;;    Home Medications:  Allergies as of 10/12/2020   No Known Allergies     Medication List       Accurate as of October 12, 2020  6:46 AM. If you have any questions, ask your nurse or doctor.        amLODipine 10 MG tablet Commonly known as: NORVASC Take 10 mg by mouth daily.   atorvastatin 40 MG tablet Commonly known as: LIPITOR Take 40 mg by mouth daily.   levofloxacin 750 MG tablet Commonly known as: Levaquin Take 1 tablet (750 mg total) by mouth daily. To take the morning of procedure 09/28/2020   montelukast 10 MG tablet Commonly known as: SINGULAIR Take 10 mg by mouth daily.       Allergies: No Known Allergies  No family history on file.  Social History:  reports that he has never smoked. He has never used smokeless tobacco. He reports that he does not drink alcohol and does not use drugs.  ROS: A complete review of systems was performed.  All systems are negative except for pertinent findings as noted.  Physical Exam:  Vital signs in last 24 hours: There were no vitals taken for this visit. Constitutional:  Alert and oriented, No acute distress Cardiovascular: Regular rate  Respiratory: Normal respiratory effort GI: Abdomen  is soft, nontender, nondistended, no abdominal masses. No CVAT.  Genitourinary: Normal male phallus, testes are descended bilaterally and non-tender and without masses, scrotum is normal in appearance without lesions or masses, perineum is normal on inspection. 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  He does have mild to moderate ED, on sildenafil  Kattan nomogram predictions:  Organ confined disease-59% Lymph node/seminal vesicle involvement--4% in each Progression free probability after prostatectomy at 5/10 years--84/74%  Plan:  The patient (and family member present) was/were counseled about the natural history of prostate cancer and the standard treatment options that are available for prostate cancer. It was explained to him how his age and life expectancy, clinical stage, Gleason score, and PSA affect his prognosis, the decision to proceed with additional staging studies, as well as how that information influences recommended treatment strategies. We discussed the roles for active surveillance, radiation therapy, surgical therapy, androgen deprivation, as well as ablative therapy options for the treatment of prostate cancer as appropriate to his individual cancer situation. We discussed the risks and benefits of these options with regard to their impact on cancer control and also in terms of potential adverse events, complications,  and impact on quality of life particularly related to urinary, bowel, and sexual function. The patient was encouraged to ask questions throughout the discussion today and all questions were answered to his stated satisfaction. In addition, the patient was provided with  and/or directed to appropriate resources and literature for further education about prostate cancer and treatment options.   He is most interested in brachytherapy, as he does not want to limit his work in the near future.  I will set up an appointment for him to see Dr. Tyler Pita at the regional [facility] to discuss curative therapy.

## 2020-10-21 ENCOUNTER — Encounter: Payer: Self-pay | Admitting: Radiation Oncology

## 2020-10-21 NOTE — Progress Notes (Signed)
GU Location of Tumor / Histology: prostatic adenocarcinoma  If Prostate Cancer, Gleason Score is (3 + 4) and PSA is (6). Prostate volume:31  Logan Lowery was referred by Dr. Sherrie Sport to Dr. Diona Fanti for further evaluation of an elevated PSA.  Biopsies of prostate (if applicable) revealed:   Past/Anticipated interventions by urology, if any: prostate biopsy, referral to Dr. Tammi Klippel for consideration of radiation therapy  Past/Anticipated interventions by medical oncology, if any: no  Weight changes, if any: No, weight is staying stable.  Bowel/Bladder complaints, if any: Reports minimal LUTS. Reports mild to moderate ED on Sildenafil. Reports some difficulty emptying his bladder.   Nausea/Vomiting, if any: no  Pain issues, if any:  No  SAFETY ISSUES:  Prior radiation? No  Pacemaker/ICD? No  Possible current pregnancy? no, male patient  Is the patient on methotrexate? No  Current Complaints / other details:  65 year old male.

## 2020-10-25 ENCOUNTER — Ambulatory Visit
Admission: RE | Admit: 2020-10-25 | Discharge: 2020-10-25 | Disposition: A | Discharge status: Home / Self Care | Payer: BC Managed Care – PPO | Source: Ambulatory Visit | Attending: Radiation Oncology | Admitting: Radiation Oncology

## 2020-10-25 ENCOUNTER — Encounter: Payer: Self-pay | Admitting: Medical Oncology

## 2020-10-25 ENCOUNTER — Encounter: Payer: Self-pay | Admitting: Radiation Oncology

## 2020-10-25 ENCOUNTER — Other Ambulatory Visit: Payer: Self-pay

## 2020-10-25 VITALS — Ht 67.0 in | Wt 180.0 lb

## 2020-10-25 DIAGNOSIS — C61 Malignant neoplasm of prostate: Secondary | ICD-10-CM

## 2020-10-25 HISTORY — DX: Malignant neoplasm of prostate: C61

## 2020-10-25 NOTE — Progress Notes (Signed)
Radiation Oncology         [phone] ________________________________  Initial Outpatient Consultation - Conducted via telephone due to current COVID-19 concerns for limiting patient exposure  Name: Logan Lowery MRN: [id number]  Date: 10/25/2020  DOB: 05-15-1956  [id number], Samul Dada, MD  Franchot Gallo, MD   REFERRING PHYSICIAN: Franchot Gallo, MD  DIAGNOSIS: 65 y.o. gentleman with stage T1c adenocarcinoma of the prostate with a Gleason's score of 3+4 and a PSA of 6    ICD-10-CM   1. Malignant neoplasm of prostate ([place])  C61     HISTORY OF PRESENT ILLNESS::Logan Lowery is a 65 y.o. gentleman.  He was noted to have an elevated PSA of 6 by his primary care physician, Dr. Sherrie Sport.  Accordingly, he was referred for evaluation in urology by Dr. Diona Fanti on 07/20/20,  digital rectal examination was performed at that time revealing a 60 gm prostate without nodules.  The patient proceeded to transrectal ultrasound with 12 biopsies of the prostate on 09/28/20.  The prostate volume measured 31 cc.  Out of 12 core biopsies, 5 were positive.  The maximum Gleason score was 3+4, and this was seen in the areas displayed below.    The patient reviewed the biopsy results with his urologist and he has kindly been referred today for discussion of potential radiation treatment options.  PREVIOUS RADIATION THERAPY: No  PAST MEDICAL HISTORY:  has a past medical history of Hypercholesteremia, Hypertension, and Prostate cancer ([place]).    PAST SURGICAL HISTORY: Past Surgical History:  Procedure Laterality Date  . COLONOSCOPY WITH PROPOFOL N/A 05/24/2020   Procedure: COLONOSCOPY WITH PROPOFOL;  Surgeon: Eloise Harman, DO;  [facility];  Service: Endoscopy;  Laterality: N/A;  Surveillance-10:45am  . POLYPECTOMY  05/24/2020   Procedure: POLYPECTOMY;  Surgeon: Eloise Harman, DO;  Location: [facility];  Service: Endoscopy;;    FAMILY HISTORY: family history is not on  file.  SOCIAL HISTORY:  reports that he has never smoked. He has never used smokeless tobacco. He reports that he does not drink alcohol and does not use drugs.  ALLERGIES: Patient has no known allergies.  MEDICATIONS:  Current Outpatient Medications  Medication Sig Dispense Refill  . amLODipine (NORVASC) 10 MG tablet Take 10 mg by mouth daily.     Marland Kitchen atorvastatin (LIPITOR) 40 MG tablet Take 40 mg by mouth daily.     . ciprofloxacin (CIPRO) 500 MG tablet Take 500 mg by mouth 2 (two) times daily.    Marland Kitchen levofloxacin (LEVAQUIN) 750 MG tablet Take 1 tablet (750 mg total) by mouth daily. To take the morning of procedure 09/28/2020 1 tablet 0  . montelukast (SINGULAIR) 10 MG tablet Take 10 mg by mouth daily.     No current facility-administered medications for this encounter.    REVIEW OF SYSTEMS:  A 15 point review of systems is documented in the electronic medical record. This was obtained by the nursing staff. However, I reviewed this with the patient to discuss relevant findings and make appropriate changes.  Pertinent items are noted in HPI..  The patient completed an IPSS and IIEF questionnaire.  His IPSS score was 1 indicating mild urinary outflow obstructive symptoms.  He indicated that his erectile function is able to complete sexual activity with sildenafil.   PHYSICAL EXAM: This patient is in no acute distress.  He is alert and oriented.  He exhibits no respiratory distress or labored breathing.  He appears neurologically intact.  His mood is pleasant.  His affect is appropriate.  Please note the digital rectal exam findings described above.  KPS = 100  100 - Normal; no complaints; no evidence of disease. 90   - Able to carry on normal activity; minor signs or symptoms of disease. 80   - Normal activity with effort; some signs or symptoms of disease. 75   - Cares for self; unable to carry on normal activity or to do active work. 60   - Requires occasional assistance, but is able to  care for most of his personal needs. 50   - Requires considerable assistance and frequent medical care. 35   - Disabled; requires special care and assistance. 75   - Severely disabled; hospital admission is indicated although death not imminent. 69   - Very sick; hospital admission necessary; active supportive treatment necessary. 10   - Moribund; fatal processes progressing rapidly. 0     - Dead  Karnofsky DA, Abelmann WH, Craver LS and Burchenal JH [phone]) The use of the nitrogen mustards in the palliative treatment of carcinoma: with particular reference to bronchogenic carcinoma Cancer 1 [phone]   LABORATORY DATA:  No results found for: WBC, HGB, HCT, MCV, PLT No results found for: NA, K, CL, CO2 No results found for: ALT, AST, GGT, ALKPHOS, BILITOT   RADIOGRAPHY: [place] Transrectal Complete  Result Date: 10/06/2020 CLINICAL DATA:  Prostate biopsy. EXAM: IR ULTRASOUND GUIDED ASPIRATION/DRAINAGE TECHNIQUE: Ultrasound guidance was provided for prostate biopsy. COMPARISON:  None. FINDINGS: See above. IMPRESSION: Ultrasound guidance provided for prostate biopsy. Electronically Signed   By: Aletta Edouard M.D.   On: 09/28/2020 08:53   US Guided Needle Placement  Result Date: 10/06/2020 CLINICAL DATA:  Prostate biopsy. EXAM: IR ULTRASOUND GUIDED ASPIRATION/DRAINAGE TECHNIQUE: Ultrasound guidance was provided for prostate biopsy. COMPARISON:  None. FINDINGS: See above. IMPRESSION: Ultrasound guidance provided for prostate biopsy. Electronically Signed   By: Aletta Edouard M.D.   On: 09/28/2020 08:53   [place] PROSTATE BIOPSY MULTIPLE  Result Date: 10/06/2020 CLINICAL DATA:  Prostate biopsy. EXAM: IR ULTRASOUND GUIDED ASPIRATION/DRAINAGE TECHNIQUE: Ultrasound guidance was provided for prostate biopsy. COMPARISON:  None. FINDINGS: See above. IMPRESSION: Ultrasound guidance provided for prostate biopsy. Electronically Signed   By: Aletta Edouard M.D.   On: 09/28/2020 08:53      IMPRESSION: This  gentleman is a 65 y.o. gentleman with stage T1c adenocarcinoma of the prostate with a Gleason's score of 3+4 and a PSA of 6.  His T-Stage, Gleason's Score, and PSA put him into the favorable intermediate risk group.  Accordingly he is eligible for a variety of potential treatment options including prostatectomy, IMRT or brachytherapy with LDR seeds.Marland Kitchen  PLAN:  This visit was conducted via telephone to spare the patient unnecessary potential exposure in the healthcare setting during the current COVID-19 pandemic.Today I reviewed the findings and workup thus far.  We discussed the natural history of prostate cancer.  We reviewed the the implications of T-stage, Gleason's Score, and PSA on decision-making and outcomes in prostate cancer.  We discussed radiation treatment in the management of prostate cancer with regard to the logistics and delivery of external beam radiation treatment as well as the logistics and delivery of prostate brachytherapy.  We compared and contrasted each of these approaches and also compared these against prostatectomy.  The patient expressed interest in prostate brachytherapy.   The patient would like to proceed with prostate brachytherapy.  I will share my findings with Dr. Diona Fanti and move forward with scheduling the procedure in the  near future.     I enjoyed meeting with him today, and will look forward to participating in the care of this very nice gentleman.  I spent 60 minutes preparing for this visit, on the phone with the patient and subsequently coordinating care.     Given current concerns for patient exposure during the COVID-19 pandemic, this encounter was conducted via telephone. The patient was notified in advance and was offered a [place] meeting to allow for face to face communication but unfortunately reported that he/she did not have the appropriate resources/technology to support such a visit and instead preferred to proceed with telephone consult. The patient  has given verbal consent for this type of encounter. The time spent during this encounter was 60 minutes. The attendants for this meeting include Tyler Pita MD and the patient, Mr. Grau. During the encounter, Tyler Pita MD was located at [facility] Oncology Department.  Patient, Mr. Pitcock was located at home.  ------------------------------------------------  Sheral Apley. Tammi Klippel, M.D.

## 2020-10-27 ENCOUNTER — Telehealth: Payer: Self-pay | Admitting: *Deleted

## 2020-10-27 NOTE — Telephone Encounter (Signed)
Called patient to ask questions, lvm for a return call

## 2020-10-28 ENCOUNTER — Other Ambulatory Visit: Payer: Self-pay | Admitting: Urology

## 2020-10-28 ENCOUNTER — Telehealth: Payer: Self-pay | Admitting: *Deleted

## 2020-10-28 NOTE — Telephone Encounter (Signed)
Called patient to inform of pre-seed appts. for 12-09-20 and his implant on 01-27-21, spoke with patient and he is aware of these appts.

## 2020-12-03 ENCOUNTER — Telehealth: Payer: Self-pay | Admitting: *Deleted

## 2020-12-03 NOTE — Telephone Encounter (Signed)
Called patient to remind of pre-seed appts. for 12-09-20, lvm for a return call

## 2020-12-08 NOTE — Progress Notes (Signed)
  Radiation Oncology         [phone]) [phone] ________________________________  Name: Logan Lowery MRN: [id number]  Date: 12/09/2020  DOB: 12/19/1955  SIMULATION AND TREATMENT PLANNING NOTE PUBIC ARCH STUDY  [id number], Samul Dada, MD  Franchot Gallo, MD  DIAGNOSIS: 65 y.o. 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      ICD-10-CM   1. Malignant neoplasm of prostate ([place])  C61     COMPLEX SIMULATION:  The patient presented today for evaluation for possible prostate seed implant. He was brought to the radiation planning suite and placed supine on the CT couch. A 3-dimensional image study set was obtained in upload to the planning computer. There, on each axial slice, I contoured the prostate gland. Then, using three-dimensional radiation planning tools I reconstructed the prostate in view of the structures from the transperineal needle pathway to assess for possible pubic arch interference. In doing so, I did not appreciate any pubic arch interference. Also, the patient's prostate volume was estimated based on the drawn structure. The volume was 32 cc.  Given the pubic arch appearance and prostate volume, patient remains a good candidate to proceed with prostate seed implant. Today, he freely provided informed written consent to proceed.    PLAN: The patient will undergo prostate seed implant.   ________________________________  Sheral Apley. Tammi Klippel, M.D.

## 2020-12-09 ENCOUNTER — Ambulatory Visit
Admission: RE | Admit: 2020-12-09 | Discharge: 2020-12-09 | Disposition: A | Discharge status: Home / Self Care | Payer: BC Managed Care – PPO | Source: Ambulatory Visit | Attending: Urology | Admitting: Urology

## 2020-12-09 ENCOUNTER — Encounter: Payer: Self-pay | Admitting: Urology

## 2020-12-09 ENCOUNTER — Ambulatory Visit (HOSPITAL_COMMUNITY)
Admission: RE | Admit: 2020-12-09 | Discharge: 2020-12-09 | Disposition: A | Discharge status: Home / Self Care | Payer: BC Managed Care – PPO | Source: Ambulatory Visit | Attending: Urology | Admitting: Urology

## 2020-12-09 ENCOUNTER — Other Ambulatory Visit: Payer: Self-pay

## 2020-12-09 ENCOUNTER — Encounter (HOSPITAL_COMMUNITY)
Admission: RE | Admit: 2020-12-09 | Discharge: 2020-12-09 | Disposition: A | Discharge status: Home / Self Care | Payer: BC Managed Care – PPO | Source: Ambulatory Visit | Attending: Urology | Admitting: Urology

## 2020-12-09 ENCOUNTER — Ambulatory Visit
Admission: RE | Admit: 2020-12-09 | Discharge: 2020-12-09 | Disposition: A | Discharge status: Home / Self Care | Payer: BC Managed Care – PPO | Source: Ambulatory Visit | Attending: Radiation Oncology | Admitting: Radiation Oncology

## 2020-12-09 DIAGNOSIS — Z01818 Encounter for other preprocedural examination: Secondary | ICD-10-CM | POA: Insufficient documentation

## 2020-12-09 DIAGNOSIS — C61 Malignant neoplasm of prostate: Secondary | ICD-10-CM | POA: Diagnosis not present

## 2020-12-09 NOTE — Progress Notes (Signed)
Patient in for pre seed appointment AYA score is 8. Denies any pain at this time. CT complete. Has no appointment to follow up with urology. Denies having ADT therapy. NO other issues.

## 2021-01-13 ENCOUNTER — Telehealth: Payer: Self-pay | Admitting: *Deleted

## 2021-01-13 NOTE — Telephone Encounter (Signed)
CALLED PATIENT TO REMIND OF LAB APPT. FOR 01-24-21, SPOKE WITH PATIENT AND HE IS AWARE OF THIS APPT.

## 2021-01-20 ENCOUNTER — Encounter (HOSPITAL_BASED_OUTPATIENT_CLINIC_OR_DEPARTMENT_OTHER): Payer: Self-pay | Admitting: Urology

## 2021-01-20 DIAGNOSIS — Z973 Presence of spectacles and contact lenses: Secondary | ICD-10-CM

## 2021-01-20 DIAGNOSIS — Z972 Presence of dental prosthetic device (complete) (partial): Secondary | ICD-10-CM

## 2021-01-20 HISTORY — DX: Presence of dental prosthetic device (complete) (partial): Z97.2

## 2021-01-20 HISTORY — DX: Presence of spectacles and contact lenses: Z97.3

## 2021-01-20 NOTE — Progress Notes (Signed)
Spoke w/ via phone for pre-op interview---pt Lab needs dos----none               Lab results------lab appt 01-24-2021 900 for cbc cmp pt ptt COVID test -----patient states asymptomatic no test needed Arrive at -------1100 am 01-27-2021 NPO after MN NO Solid Food.  Clear liquids from MN until---1000 am then npo Med rec completed Medications to take morning of surgery -----uroxetral, amlodipine, atorvastatin,  Diabetic medication -----n/a Patient aware to have Driver (ride ) / caregiver    friend Joelene Millin hardy or daughter Claire Shown for 24 hours after surgery  Patient Special Instructions -----fleets enema am of surgery Pre-Op special Istructions -----none Patient verbalized understanding of instructions that were given at this phone interview. Patient denies shortness of breath, chest pain, fever, cough at this phone interview.   Ekg 12-09-2020 epic Chest xray 12-29-2020 epic

## 2021-01-24 ENCOUNTER — Other Ambulatory Visit: Payer: Self-pay

## 2021-01-24 ENCOUNTER — Encounter (HOSPITAL_COMMUNITY)
Admission: RE | Admit: 2021-01-24 | Discharge: 2021-01-24 | Disposition: A | Discharge status: Home / Self Care | Payer: BC Managed Care – PPO | Source: Ambulatory Visit | Attending: Urology | Admitting: Urology

## 2021-01-24 DIAGNOSIS — Z01812 Encounter for preprocedural laboratory examination: Secondary | ICD-10-CM | POA: Insufficient documentation

## 2021-01-24 DIAGNOSIS — Z87891 Personal history of nicotine dependence: Secondary | ICD-10-CM | POA: Diagnosis not present

## 2021-01-24 DIAGNOSIS — Z8616 Personal history of COVID-19: Secondary | ICD-10-CM | POA: Diagnosis not present

## 2021-01-24 DIAGNOSIS — C61 Malignant neoplasm of prostate: Secondary | ICD-10-CM | POA: Diagnosis present

## 2021-01-24 LAB — COMPREHENSIVE METABOLIC PANEL
ALT: 28 U/L (ref 0–44)
AST: 33 U/L (ref 15–41)
Albumin: 4.6 g/dL (ref 3.5–5.0)
Alkaline Phosphatase: 76 U/L (ref 38–126)
Anion gap: 6 (ref 5–15)
BUN: 22 mg/dL (ref 8–23)
CO2: 27 mmol/L (ref 22–32)
Calcium: 9.7 mg/dL (ref 8.9–10.3)
Chloride: 106 mmol/L (ref 98–111)
Creatinine, Ser: 0.92 mg/dL (ref 0.61–1.24)
GFR, Estimated: >60 mL/min (ref >60)
Glucose, Bld: 100 mg/dL — ABNORMAL HIGH (ref 70–99)
Potassium: 4 mmol/L (ref 3.5–5.1)
Sodium: 139 mmol/L (ref 135–145)
Total Bilirubin: 0.5 mg/dL (ref 0.3–1.2)
Total Protein: 8 g/dL (ref 6.5–8.1)

## 2021-01-24 LAB — CBC
HCT: 45.1 % (ref 39.0–52.0)
Hemoglobin: 15.3 g/dL (ref 13.0–17.0)
MCH: 30.3 pg (ref 26.0–34.0)
MCHC: 33.9 g/dL (ref 30.0–36.0)
MCV: 89.3 fL (ref 80.0–100.0)
Platelets: 241 10*3/uL (ref 150–400)
RBC: 5.05 MIL/uL (ref 4.22–5.81)
RDW: 13.2 % (ref 11.5–15.5)
WBC: 6 10*3/uL (ref 4.0–10.5)
nRBC: 0 % (ref 0.0–0.2)

## 2021-01-24 LAB — PROTIME-INR
INR: 1 (ref 0.8–1.2)
Prothrombin Time: 12.9 seconds (ref 11.4–15.2)

## 2021-01-24 LAB — APTT: aPTT: 30 seconds (ref 24–36)

## 2021-01-24 LAB — PSA: Prostatic Specific Antigen: 4.62 ng/mL — ABNORMAL HIGH (ref 0.00–4.00)

## 2021-01-26 ENCOUNTER — Telehealth: Payer: Self-pay | Admitting: *Deleted

## 2021-01-26 NOTE — H&P (Signed)
H&P  Chief Complaint:  prostate cancer  History of Present Illness:  patient presents for I 125 brachytherapy in [facility].  He has a history of prostate cancer.   TRUS/Bx on 3.22.2022. PSA 6  Prostate volume 31 mL, PSAD 0.19. 5 cores revealed GG2 PCA- Lt apex lateral (5% involvement), Rt apex lateral (70%), Lt apex medial (5%), Lt mid lateral (20%), Lt mid medial (5%)A  Past Medical History:  Diagnosis Date   COVID 07/17/2019   cough x 14 days all symptoms resolved result in care everywhere   Hypercholesteremia    Hypertension    Prostate cancer ([place])    Wears dentures 01/20/2021   upper   Wears glasses 01/20/2021    Past Surgical History:  Procedure Laterality Date   COLONOSCOPY WITH PROPOFOL N/A 05/24/2020   Procedure: COLONOSCOPY WITH PROPOFOL;  Surgeon: Eloise Harman, DO;  [facility];  Service: Endoscopy;  Laterality: N/A;  Surveillance-10:45am   POLYPECTOMY  05/24/2020   Procedure: POLYPECTOMY;  Surgeon: Eloise Harman, DO;  [facility];  Service: Endoscopy;;    Home Medications:  Allergies as of 01/26/2021   No Known Allergies      Medication List      Notice   Cannot display discharge medications because the patient has not yet been admitted.     Allergies: No Known Allergies  Family History  Problem Relation Age of Onset   Prostate cancer Brother    Breast cancer Neg Hx    Colon cancer Neg Hx    Pancreatic cancer Neg Hx     Social History:  reports that he has quit smoking. His smoking use included cigarettes. He has a 3.50 pack-year smoking history. He has never used smokeless tobacco. He reports that he does not drink alcohol and does not use drugs.  ROS: A complete review of systems was performed.  All systems are negative except for pertinent findings as noted.  Physical Exam:  Vital signs in last 24 hours: Ht 5\' 7"  (1.702 m)   Wt 81.6 kg   BMI 28.19 kg/m  Constitutional:  Alert and oriented, No acute  distress Cardiovascular: Regular rate  Respiratory: Normal respiratory effort Neurologic: Grossly intact, no focal deficits Psychiatric: Normal mood and affect  Laboratory Data:  Recent Labs    01/24/21 0910  WBC 6.0  HGB 15.3  HCT 45.1  PLT 241    Recent Labs    01/24/21 0910  NA 139  K 4.0  CL 106  GLUCOSE 100*  BUN 22  CALCIUM 9.7  CREATININE 0.92     No results found for this or any previous visit (from the past 24 hour(s)). No results found for this or any previous visit (from the past 240 hour(s)).  Renal Function: Recent Labs    01/24/21 0910  CREATININE 0.92   Estimated Creatinine Clearance: 81.9 mL/min (by C-G formula based on SCr of 0.92 mg/dL).  Radiologic Imaging: No results found.  Impression/Assessment:   Favorable intermediate risk prostate cancer  Plan:   I 125 brachytherapy, placement of Space OAR

## 2021-01-26 NOTE — Telephone Encounter (Signed)
Called patient to remind of procedure for 01-27-21, lvm for a return call

## 2021-01-27 ENCOUNTER — Encounter (HOSPITAL_BASED_OUTPATIENT_CLINIC_OR_DEPARTMENT_OTHER)
Admission: RE | Disposition: A | Discharge status: Home / Self Care | Payer: Self-pay | Source: Ambulatory Visit | Attending: Urology

## 2021-01-27 ENCOUNTER — Ambulatory Visit (HOSPITAL_COMMUNITY): Payer: BC Managed Care – PPO

## 2021-01-27 ENCOUNTER — Ambulatory Visit (HOSPITAL_BASED_OUTPATIENT_CLINIC_OR_DEPARTMENT_OTHER)
Admission: RE | Admit: 2021-01-27 | Discharge: 2021-01-27 | Disposition: A | Discharge status: Home / Self Care | Payer: BC Managed Care – PPO | Source: Ambulatory Visit | Attending: Urology | Admitting: Urology

## 2021-01-27 ENCOUNTER — Other Ambulatory Visit: Payer: Self-pay

## 2021-01-27 ENCOUNTER — Ambulatory Visit (HOSPITAL_BASED_OUTPATIENT_CLINIC_OR_DEPARTMENT_OTHER): Payer: BC Managed Care – PPO | Admitting: Anesthesiology

## 2021-01-27 ENCOUNTER — Encounter (HOSPITAL_BASED_OUTPATIENT_CLINIC_OR_DEPARTMENT_OTHER): Payer: Self-pay | Admitting: Urology

## 2021-01-27 DIAGNOSIS — Z8616 Personal history of COVID-19: Secondary | ICD-10-CM | POA: Insufficient documentation

## 2021-01-27 DIAGNOSIS — Z87891 Personal history of nicotine dependence: Secondary | ICD-10-CM | POA: Insufficient documentation

## 2021-01-27 DIAGNOSIS — C61 Malignant neoplasm of prostate: Secondary | ICD-10-CM | POA: Insufficient documentation

## 2021-01-27 HISTORY — PX: SPACE OAR INSTILLATION: SHX6769

## 2021-01-27 HISTORY — PX: RADIOACTIVE SEED IMPLANT: SHX5150

## 2021-01-27 HISTORY — PX: CYSTOSCOPY: SHX5120

## 2021-01-27 SURGERY — INSERTION, RADIATION SOURCE, PROSTATE
Anesthesia: General | Site: Rectum

## 2021-01-27 MED ORDER — SODIUM CHLORIDE (PF) 0.9 % IJ SOLN
INTRAMUSCULAR | Status: DC | PRN
  Administered 2021-01-27: 10 mL

## 2021-01-27 MED ORDER — GLYCOPYRROLATE PF 0.2 MG/ML IJ SOSY
PREFILLED_SYRINGE | INTRAMUSCULAR | Status: AC
  Filled 2021-01-27: qty 1

## 2021-01-27 MED ORDER — LACTATED RINGERS IV SOLN
INTRAVENOUS | Status: DC
  Administered 2021-01-27: 1000 mL via INTRAVENOUS

## 2021-01-27 MED ORDER — OXYCODONE HCL 5 MG/5ML PO SOLN: 5.0000 mg | Freq: Once | ORAL | Status: DC | PRN

## 2021-01-27 MED ORDER — ONDANSETRON HCL 4 MG/2ML IJ SOLN
INTRAMUSCULAR | Status: DC | PRN
  Administered 2021-01-27: 4 mg via INTRAVENOUS

## 2021-01-27 MED ORDER — DEXMEDETOMIDINE (PRECEDEX) IN NS 20 MCG/5ML (4 MCG/ML) IV SYRINGE
PREFILLED_SYRINGE | INTRAVENOUS | Status: DC | PRN
  Administered 2021-01-27: 8 ug via INTRAVENOUS

## 2021-01-27 MED ORDER — IOHEXOL 300 MG/ML  SOLN
INTRAMUSCULAR | Status: DC | PRN
  Administered 2021-01-27: 7 mL

## 2021-01-27 MED ORDER — KETOROLAC TROMETHAMINE 30 MG/ML IJ SOLN: 30.0000 mg | Freq: Once | INTRAMUSCULAR | Status: DC | PRN

## 2021-01-27 MED ORDER — SODIUM CHLORIDE 0.9 % IV SOLN
2.0000 g | INTRAVENOUS | Status: AC
  Administered 2021-01-27: 2 g via INTRAVENOUS

## 2021-01-27 MED ORDER — PROPOFOL 10 MG/ML IV BOLUS
INTRAVENOUS | Status: DC | PRN
  Administered 2021-01-27: 60 mg via INTRAVENOUS
  Administered 2021-01-27: 200 mg via INTRAVENOUS

## 2021-01-27 MED ORDER — EPHEDRINE 5 MG/ML INJ
INTRAVENOUS | Status: AC
  Filled 2021-01-27: qty 5

## 2021-01-27 MED ORDER — DEXAMETHASONE SODIUM PHOSPHATE 4 MG/ML IJ SOLN
INTRAMUSCULAR | Status: DC | PRN
  Administered 2021-01-27: 8 mg via INTRAVENOUS

## 2021-01-27 MED ORDER — PHENYLEPHRINE HCL (PRESSORS) 10 MG/ML IV SOLN
INTRAVENOUS | Status: DC | PRN
  Administered 2021-01-27: 80 ug via INTRAVENOUS
  Administered 2021-01-27: 120 ug via INTRAVENOUS

## 2021-01-27 MED ORDER — FENTANYL CITRATE (PF) 100 MCG/2ML IJ SOLN
25.0000 ug | INTRAMUSCULAR | Status: DC | PRN
  Administered 2021-01-27 (×3): 25 ug via INTRAVENOUS

## 2021-01-27 MED ORDER — OXYCODONE HCL 5 MG PO TABS: 5.0000 mg | ORAL_TABLET | Freq: Once | ORAL | Status: DC | PRN

## 2021-01-27 MED ORDER — DEXAMETHASONE SODIUM PHOSPHATE 10 MG/ML IJ SOLN
INTRAMUSCULAR | Status: AC
  Filled 2021-01-27: qty 1

## 2021-01-27 MED ORDER — DEXMEDETOMIDINE (PRECEDEX) IN NS 20 MCG/5ML (4 MCG/ML) IV SYRINGE
PREFILLED_SYRINGE | INTRAVENOUS | Status: AC
  Filled 2021-01-27: qty 5

## 2021-01-27 MED ORDER — ONDANSETRON HCL 4 MG/2ML IJ SOLN
INTRAMUSCULAR | Status: AC
  Filled 2021-01-27: qty 2

## 2021-01-27 MED ORDER — SODIUM CHLORIDE 0.9 % IV SOLN
INTRAVENOUS | Status: AC
  Filled 2021-01-27: qty 2

## 2021-01-27 MED ORDER — FENTANYL CITRATE (PF) 100 MCG/2ML IJ SOLN
INTRAMUSCULAR | Status: DC | PRN
  Administered 2021-01-27 (×2): 50 ug via INTRAVENOUS

## 2021-01-27 MED ORDER — FENTANYL CITRATE (PF) 100 MCG/2ML IJ SOLN
INTRAMUSCULAR | Status: AC
  Filled 2021-01-27: qty 2

## 2021-01-27 MED ORDER — GLYCOPYRROLATE 0.2 MG/ML IJ SOLN
INTRAMUSCULAR | Status: DC | PRN
  Administered 2021-01-27 (×2): .1 mg via INTRAVENOUS

## 2021-01-27 MED ORDER — SODIUM CHLORIDE 0.9 % IV SOLN
INTRAVENOUS | Status: AC | PRN
  Administered 2021-01-27: 1000 mL

## 2021-01-27 MED ORDER — LIDOCAINE HCL (CARDIAC) PF 100 MG/5ML IV SOSY
PREFILLED_SYRINGE | INTRAVENOUS | Status: DC | PRN
  Administered 2021-01-27: 100 mg via INTRAVENOUS

## 2021-01-27 MED ORDER — EPHEDRINE SULFATE 50 MG/ML IJ SOLN
INTRAMUSCULAR | Status: DC | PRN
  Administered 2021-01-27: 10 mg via INTRAVENOUS
  Administered 2021-01-27: 5 mg via INTRAVENOUS

## 2021-01-27 MED ORDER — ONDANSETRON HCL 4 MG/2ML IJ SOLN: 4.0000 mg | Freq: Once | INTRAMUSCULAR | Status: DC | PRN

## 2021-01-27 SURGICAL SUPPLY — 40 items
BAG DRN RND TRDRP ANRFLXCHMBR (UROLOGICAL SUPPLIES) ×3
BAG URINE DRAIN 2000ML AR STRL (UROLOGICAL SUPPLIES) ×4 IMPLANT
BLADE CLIPPER SENSICLIP SURGIC (BLADE) ×4 IMPLANT
CATH FOLEY 2WAY SLVR  5CC 16FR (CATHETERS) ×1
CATH FOLEY 2WAY SLVR 5CC 16FR (CATHETERS) ×3 IMPLANT
CATH ROBINSON RED A/P 16FR (CATHETERS) IMPLANT
CATH ROBINSON RED A/P 20FR (CATHETERS) ×4 IMPLANT
CLOTH BEACON ORANGE TIMEOUT ST (SAFETY) ×4 IMPLANT
CNTNR URN SCR LID CUP LEK RST (MISCELLANEOUS) ×6 IMPLANT
CONT SPEC 4OZ STRL OR WHT (MISCELLANEOUS) ×8
COVER BACK TABLE 60X90IN (DRAPES) ×4 IMPLANT
COVER MAYO STAND STRL (DRAPES) ×4 IMPLANT
DRAPE C-ARM 35X43 STRL (DRAPES) ×4 IMPLANT
DRSG TEGADERM 4X4.75 (GAUZE/BANDAGES/DRESSINGS) ×4 IMPLANT
DRSG TEGADERM 8X12 (GAUZE/BANDAGES/DRESSINGS) ×8 IMPLANT
GAUZE SPONGE 4X4 12PLY STRL (GAUZE/BANDAGES/DRESSINGS) ×4 IMPLANT
GLOVE SURG ENC MOIS LTX SZ6.5 (GLOVE) ×4 IMPLANT
GLOVE SURG ENC MOIS LTX SZ7.5 (GLOVE) IMPLANT
GLOVE SURG ENC MOIS LTX SZ8 (GLOVE) ×8 IMPLANT
GLOVE SURG NEOP MICRO LF SZ6.5 (GLOVE) ×8 IMPLANT
GLOVE SURG ORTHO LTX SZ8.5 (GLOVE) ×4 IMPLANT
GLOVE SURG UNDER POLY LF SZ6.5 (GLOVE) IMPLANT
GLOVE SURG UNDER POLY LF SZ7.5 (GLOVE) ×4 IMPLANT
GOWN STRL REUS W/ TWL LRG LVL3 (GOWN DISPOSABLE) ×3 IMPLANT
GOWN STRL REUS W/TWL LRG LVL3 (GOWN DISPOSABLE) ×4
GOWN STRL REUS W/TWL XL LVL3 (GOWN DISPOSABLE) ×4 IMPLANT
HOLDER FOLEY CATH W/STRAP (MISCELLANEOUS) ×4 IMPLANT
I-SEED AGX100 ×256 IMPLANT
IMPL SPACEOAR VUE SYSTEM (Spacer) ×3 IMPLANT
IMPLANT SPACEOAR VUE SYSTEM (Spacer) ×4 IMPLANT
IV NS 1000ML (IV SOLUTION) ×4
IV NS 1000ML BAXH (IV SOLUTION) ×3 IMPLANT
KIT TURNOVER CYSTO (KITS) ×4 IMPLANT
MARKER SKIN DUAL TIP RULER LAB (MISCELLANEOUS) ×4 IMPLANT
PACK CYSTO (CUSTOM PROCEDURE TRAY) ×4 IMPLANT
SUT BONE WAX W31G (SUTURE) IMPLANT
SYR 10ML LL (SYRINGE) ×4 IMPLANT
TOWEL OR 17X26 10 PK STRL BLUE (TOWEL DISPOSABLE) ×4 IMPLANT
UNDERPAD 30X36 HEAVY ABSORB (UNDERPADS AND DIAPERS) ×8 IMPLANT
WATER STERILE IRR 500ML POUR (IV SOLUTION) ×4 IMPLANT

## 2021-01-27 NOTE — Anesthesia Procedure Notes (Addendum)
Procedure Name: LMA Insertion Date/Time: 01/27/2021 1:09 PM Performed by: Georgeanne Nim, CRNA Pre-anesthesia Checklist: Patient identified, Emergency Drugs available, Suction available, Patient being monitored and Timeout performed Patient Re-evaluated:Patient Re-evaluated prior to induction Oxygen Delivery Method: Circle system utilized Preoxygenation: Pre-oxygenation with 100% oxygen Induction Type: IV induction Ventilation: Mask ventilation without difficulty LMA: LMA inserted LMA Size: 4.0 Placement Confirmation: breath sounds checked- equal and bilateral, CO2 detector and positive ETCO2 Tube secured with: Tape Dental Injury: Teeth and Oropharynx as per pre-operative assessment

## 2021-01-27 NOTE — Anesthesia Postprocedure Evaluation (Signed)
Anesthesia Post Note  Patient: Logan Lowery  Procedure(s) Performed: RADIOACTIVE SEED IMPLANT/BRACHYTHERAPY IMPLANT (Prostate) SPACE OAR INSTILLATION (Rectum) CYSTOSCOPY FLEXIBLE (Bladder)     Patient location during evaluation: PACU Anesthesia Type: General Level of consciousness: awake and alert Pain management: pain level controlled Vital Signs Assessment: post-procedure vital signs reviewed and stable Respiratory status: spontaneous breathing, nonlabored ventilation, respiratory function stable and patient connected to nasal cannula oxygen Cardiovascular status: blood pressure returned to baseline and stable Postop Assessment: no apparent nausea or vomiting Anesthetic complications: no   No notable events documented.  Last Vitals:  Vitals:   01/27/21 1502 01/27/21 1509  BP:  (!) 150/98  Pulse: 76 76  Resp: (!) 2 11  Temp:    SpO2: 97% 92%    Last Pain:  Vitals:   01/27/21 1508  TempSrc:   PainSc: 4                  Logan Lowery S

## 2021-01-27 NOTE — Discharge Instructions (Addendum)
Radioactive Seed Implant Home Care Instructions   Activity:    Rest for the remainder of the day.  Do not drive or operate equipment today.  You may resume normal  activities in a few days as instructed by your physician, without risk of harmful radiation exposure to those around you, provided you follow the time and distance precautions on the Radiation Oncology Instruction Sheet.   Meals: Drink plenty of lipuids and eat light foods, such as gelatin or soup this evening .  You may return to normal meal plan tomorrow.  Return To Work: You may return to work as instructed by Naval architect.  Special Instruction:   If any seeds are found, use tweezers to pick up seeds and place in a glass container of any kind and bring to your physician's office.  Call your physician if any of these symptoms occur:  Persistent or heavy bleeding Urine stream diminishes or stops completely after catheter is removed Fever equal to or greater than 101 degrees F Cloudy urine with a strong foul odor Severe pain  You may feel some burning pain and/or hesitancy when you urinate after the catheter is removed.  These symptoms may increase over the next few weeks, but should diminish within forur to six weeks.  Applying moist heat to the lower abdomen or a hot tub bath may help relieve the pain.  If the discomfort becomes severe, please call your physician for additional medications.  PROSTATE CANCER TREATMENT WITH RADIOACTIVE IODINE-125 SEED IMPLANT  This instruction sheet is intended to discuss implantation of Iodine-125 seeds as treatment for cancer of the prostate. It will explain in detail what you may expect from this treatment and what precautions are necessary as a result of the treatment. Iodine-125 emits a relatively low energy radiation. The radioactive seeds are surgically implanted directly into the prostate gland. Most of the radiation is contained within the prostate gland. A very small amount is  present outside the body.The precautions that we ask you to take are to ensure that those around you are protected from unnecessary radiation. The principles of radiation safety that you need to understand are:  DISTANCE: The further a person is from the radioactive implant the less radiation they will be receiving. The amount of radiation received falls off quite rapidly with distance. More specific guidelines are given in the table on the last page.  TIME: The amount of radiation a person is exposed to is directly proportional to the amount of time that is spent in close proximity to the radioactive implant. Very little radiation will be received during short periods. See the table on the last page for more specific guideline.  CHILDREN UNDER AGE 26 Children should not be allowed to sit on your lap or otherwise be in very close contact for more than a few minutes for the first 6-8 weeks following the implant. You may affectionately greet (hug/kiss) a child for a short period of time, but remember, the longer you are in close proximity with that child the more radiation they are being exposed to. At a distance of 6 feet there is no limit to the length of time you may spend together. See specific guidelines on the last page.  PREGNANT OR POSSIBLY PREGNANT WOMEN Pregnant women should avoid prolonged close physical contact with you for the first 6-8 weeks after implant. At a distance of 6 feet there is no limit to the length of time you may spend together. Pregnant women or possibly pregnant  women can safely be in close contact with you for a limited period of time. See the last page for guidelines.  FAMILY RELATIONS You may sleep in the same bed as your partner (provided she is not pregnant or under the age of 57). Sexual intercourse, using a condom, may be resumed 2 weeks after the implant. Your semen may be discolored, dark brown or black. This is normal and is the result of bleeding that may have  occurred during the implant. After 3-4 weeks it will not be necessary to use a condom.  DAILY ACTIVITIES You may resume normal activities in a few days (example: work, shopping, church) without the risk of harmful radiation exposure to those around you provided you keep in mind the time and distance precautions. Objects that you touch or item that you use do not become radioactive. Linens, clothing, tableware, and dishes may be used by other persons without special precautions. Your bodily wastes (urine and stool) are not radioactive.  SPECIAL PRECAUTIONS It is possible to lose implanted Iodine-125 seed(s) through urination. Although it is possible to pass seeds indefinitely, it is most likely to occur immediately after catheter removal. To prevent this from happening the catheter that was in place during the implant procedure is removed immediately after the implant and a cystoscopy procedure is performed. The process of removing the catheter and the cystoscopy procedure should dislodge and remove any seeds that are not firmly imbedded in the prostate tissue. However, you should watch for seeds if/when you remove your catheter at home. The seeds are silver colored and the size of a grain of rice. In the unlikely event that a seed is seen after urination, simply flush the seed down the toilet. The seed should not be handled with your fingers, not even with a glove or napkin. A spoon or tweezers can be used to pick up a seed. The Radiation Oncology department is open Monday - Friday from 8:00 am to 5:30 pm with a Radiation Oncologist on call at all times. He or she may be reached by calling [phone]. If you are to be hospitalized or if death should occur, your family should notify the Runner, broadcasting/film/video.  SIDE EFFECTS There are very few side effects associate with the implant procedure. Minor burning with urination, weak stream, hesitancy, intermittency, frequency, mild pain or feeling unable to  pass your urine freely are common and usually stop in one to four months. If these symptoms are extremely uncomfortable, contact your physician.  RADIATION SAFETY GUIDELINES PROSTATE CANCER TREATMENT WITH RADIOACTIVE IODINE-125 SEED IMPLANT  The following guidelines will limit exposure to less than naturally occurring background radiation.  PERSONS AGE 60-45 (if able to become pregnant)  FOR 8 WEEKS FOLLOWING IMPLANT  At a distance of 1 foot: limit time to less than 2 hours/week At a distance of 3 feet: limit time to 20 hours/week At a distance of 6 feet: no restrictions  AFTER 8 WEEKS No restrictions  CHILDREN UNDER AGE 60, PREGNANT WOMEN OR POSSIBLY PREGNANT WOMEN  FOR 8 WEEKS FOLLOWING IMPLANT At a distance of 1 foot: limit time to 10 minutes/week At a distance of 3 feet: limit time to 2 hours/week At a distance of 6 feet: no restrictions  AFTER 8 WEEKS No restrictions  PERSONS OVER THE AGE OF 45 AND DO NOT EXPECT TO HAVE ANY MORE CHILDREN No restrictions  Updated by [facility] in January 2020  Post Anesthesia Home Care Instructions  Activity: Get plenty of rest for  the remainder of the day. A responsible individual must stay with you for 24 hours following the procedure.  For the next 24 hours, DO NOT: -Drive a car -Paediatric nurse -Drink alcoholic beverages -Take any medication unless instructed by your physician -Make any legal decisions or sign important papers.  Meals: Start with liquid foods such as gelatin or soup. Progress to regular foods as tolerated. Avoid greasy, spicy, heavy foods. If nausea and/or vomiting occur, drink only clear liquids until the nausea and/or vomiting subsides. Call your physician if vomiting continues.  Special Instructions/Symptoms: Your throat may feel dry or sore from the anesthesia or the breathing tube placed in your throat during surgery. If this causes discomfort, gargle with warm salt water. The discomfort should disappear  within 24 hours.    Post Anesthesia Home Care Instructions  Activity: Get plenty of rest for the remainder of the day. A responsible individual must stay with you for 24 hours following the procedure.  For the next 24 hours, DO NOT: -Drive a car -Paediatric nurse -Drink alcoholic beverages -Take any medication unless instructed by your physician -Make any legal decisions or sign important papers.  Meals: Start with liquid foods such as gelatin or soup. Progress to regular foods as tolerated. Avoid greasy, spicy, heavy foods. If nausea and/or vomiting occur, drink only clear liquids until the nausea and/or vomiting subsides. Call your physician if vomiting continues.  Special Instructions/Symptoms: Your throat may feel dry or sore from the anesthesia or the breathing tube placed in your throat during surgery. If this causes discomfort, gargle with warm salt water. The discomfort should disappear within 24 hours.  If you had a scopolamine patch placed behind your ear for the management of post- operative nausea and/or vomiting:  1. The medication in the patch is effective for 72 hours, after which it should be removed.  Wrap patch in a tissue and discard in the trash. Wash hands thoroughly with soap and water. 2. You may remove the patch earlier than 72 hours if you experience unpleasant side effects which may include dry mouth, dizziness or visual disturbances. 3. Avoid touching the patch. Wash your hands with soap and water after contact with the patch.

## 2021-01-27 NOTE — Transfer of Care (Signed)
Immediate Anesthesia Transfer of Care Note  Patient: Logan Lowery  Procedure(s) Performed: RADIOACTIVE SEED IMPLANT/BRACHYTHERAPY IMPLANT (Prostate) SPACE OAR INSTILLATION (Rectum) CYSTOSCOPY FLEXIBLE (Bladder)  Patient Location: PACU  Anesthesia Type:General  Level of Consciousness: awake, alert , oriented and patient cooperative  Airway & Oxygen Therapy: Patient Spontanous Breathing and Patient connected to nasal cannula oxygen  Post-op Assessment: Report given to RN and Post -op Vital signs reviewed and stable  Post vital signs: Reviewed and stable  Last Vitals:  Vitals Value Taken Time  BP 135/89 01/27/21 1430  Temp 36.3 C 01/27/21 1427  Pulse 93 01/27/21 1434  Resp 6 01/27/21 1434  SpO2 98 % 01/27/21 1434  Vitals shown include unvalidated device data.  Last Pain:  Vitals:   01/27/21 1427  TempSrc:   PainSc: 0-No pain      Patients Stated Pain Goal: 3 (89/16/94 5038)  Complications: No notable events documented.

## 2021-01-27 NOTE — Op Note (Signed)
Preoperative diagnosis: Clinical stage TI C adenocarcinoma the prostate   Postoperative diagnosis: Same   Procedure: I-125 prostate seed implantation, SpaceOAR placement, flexible cystoscopy  Surgeon: Lillette Boxer. Malorie Bigford M.D.  Radiation Oncologist: Tyler Pita, M.D.  Anesthesia: Gen.   Indications: Patient  was diagnosed with clinical stage TI C prostate cancer. We had extensive discussion with him about treatment options versus. He elected to proceed with seed implantation. He underwent consultation my office as well as with Dr. Tammi Klippel. He appeared to understand the advantages disadvantages potential risks of this treatment option. Full informed consent has been obtained.   Technique and findings: Patient was brought the operating room where he had successful induction of general anesthesia. He was placed in dorso-lithotomy position and prepped and draped in usual manner. Appropriate surgical timeout was performed. Radiation oncology department placed a transrectal ultrasound probe anchoring stand. Foley catheter with contrast in the balloon was inserted without difficulty. Anchoring needles were placed within the prostate. Rectal tube was placed. Real-time contouring of the urethra prostate and rectum were performed and the dosing parameters were established. Targeted dose was 145 gray.  I was then called  to the operating suite suite for placement of the needles. A second timeout was performed. All needle passage was done with real-time transrectal ultrasound guidance with the sagittal plane. A total of 17 needles were placed.  64 active seeds were implanted.  I then proceeded with placement of SpaceOAR by introducing a needle with the bevel angled inferiorly approximately 2 cm superior to the anus. This was angled downward and under direct ultrasound was placed within the space between the prostatic capsule and rectum. This was confirmed with a small amount of sterile saline injected and  this was performed under direct ultrasound. I then attached the SpaceOAR to the needle and injected this in the space between the prostate and rectum with good placement noted. The Foley catheter was removed and flexible cystoscopy failed to show any seeds outside the prostate.  The patient was brought to recovery room in stable condition, having tolerated the procedure well.Logan Lowery

## 2021-01-27 NOTE — Anesthesia Preprocedure Evaluation (Signed)
Anesthesia Evaluation  Patient identified by MRN, date of birth, ID band Patient awake    Reviewed: Allergy & Precautions, NPO status , Patient's Chart, lab work & pertinent test results  Airway Mallampati: II  TM Distance: >3 FB Neck ROM: Full    Dental  (+) Upper Dentures   Pulmonary neg pulmonary ROS, former smoker,    Pulmonary exam normal breath sounds clear to auscultation       Cardiovascular hypertension, Normal cardiovascular exam Rhythm:Regular Rate:Normal     Neuro/Psych negative neurological ROS  negative psych ROS   GI/Hepatic negative GI ROS, Neg liver ROS,   Endo/Other  negative endocrine ROS  Renal/GU negative Renal ROS  negative genitourinary   Musculoskeletal negative musculoskeletal ROS (+)   Abdominal   Peds negative pediatric ROS (+)  Hematology negative hematology ROS (+)   Anesthesia Other Findings   Reproductive/Obstetrics negative OB ROS                             Anesthesia Physical Anesthesia Plan  ASA: 2  Anesthesia Plan: General   Post-op Pain Management:    Induction: Intravenous  PONV Risk Score and Plan: 2 and Ondansetron, Dexamethasone and Treatment may vary due to age or medical condition  Airway Management Planned: LMA  Additional Equipment:   Intra-op Plan:   Post-operative Plan: Extubation in OR  Informed Consent: I have reviewed the patients History and Physical, chart, labs and discussed the procedure including the risks, benefits and alternatives for the proposed anesthesia with the patient or authorized representative who has indicated his/her understanding and acceptance.     Dental advisory given  Plan Discussed with: CRNA and Surgeon  Anesthesia Plan Comments:         Anesthesia Quick Evaluation

## 2021-01-27 NOTE — Interval H&P Note (Signed)
History and Physical Interval Note:  01/27/2021 12:49 PM  Logan Lowery  has presented today for surgery, with the diagnosis of PROSTATE CANCER.  The various methods of treatment have been discussed with the patient and family. After consideration of risks, benefits and other options for treatment, the patient has consented to  Procedure(s) with comments: RADIOACTIVE SEED IMPLANT/BRACHYTHERAPY IMPLANT (N/A) -    SEEDS IMPLANTED SPACE OAR INSTILLATION (N/A) CYSTOSCOPY FLEXIBLE (N/A) - NO SEEDS FOUND IN BLADDER as a surgical intervention.  The patient's history has been reviewed, patient examined, no change in status, stable for surgery.  I have reviewed the patient's chart and labs.  Questions were answered to the patient's satisfaction.     Lillette Boxer Cortnee Steinmiller

## 2021-01-28 ENCOUNTER — Encounter: Payer: Self-pay | Admitting: Urology

## 2021-01-28 ENCOUNTER — Encounter (HOSPITAL_BASED_OUTPATIENT_CLINIC_OR_DEPARTMENT_OTHER): Payer: Self-pay | Admitting: Urology

## 2021-01-28 ENCOUNTER — Telehealth: Payer: Self-pay | Admitting: *Deleted

## 2021-01-28 ENCOUNTER — Telehealth: Payer: Self-pay

## 2021-01-28 NOTE — Progress Notes (Signed)
  Radiation Oncology         [phone]) [phone] ________________________________  Name: Logan Lowery MRN: [id number]  Date: 01/28/2021  DOB: [birth year]       Prostate Seed Implant  [id number], Samul Dada, MD  No ref. provider found  DIAGNOSIS:  65 y.o. 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    No diagnosis found.  PROCEDURE: Insertion of radioactive I-125 seeds into the prostate gland.  RADIATION DOSE: 145 Gy, definitive therapy.  TECHNIQUE: Logan Lowery was brought to the operating room with the urologist. He was placed in the dorsolithotomy position. He was catheterized and a rectal tube was inserted. The perineum was shaved, prepped and draped. The ultrasound probe was then introduced into the rectum to see the prostate gland.  TREATMENT DEVICE: A needle grid was attached to the ultrasound probe stand and anchor needles were placed.  3D PLANNING: The prostate was imaged in 3D using a sagittal sweep of the prostate probe. These images were transferred to the planning computer. There, the prostate, urethra and rectum were defined on each axial reconstructed image. Then, the software created an optimized 3D plan and a few seed positions were adjusted. The quality of the plan was reviewed using [facility] information for the target and the following two organs at risk:  Urethra and Rectum.  Then the accepted plan was printed and handed off to the radiation therapist.  Under my supervision, the custom loading of the seeds and spacers was carried out and loaded into sealed vicryl sleeves.  These pre-loaded needles were then placed into the needle holder.Marland Kitchen  PROSTATE VOLUME STUDY:  Using transrectal ultrasound the volume of the prostate was verified to be 33.7 cc.  SPECIAL TREATMENT PROCEDURE/SUPERVISION AND HANDLING: The pre-loaded needles were then delivered under sagittal guidance. A total of 17 needles  were used to deposit 64 seeds in the prostate gland. The individual seed activity was 0.414 mCi.  SpaceOAR:  Yes  COMPLEX SIMULATION: At the end of the procedure, an anterior radiograph of the pelvis was obtained to document seed positioning and count. Cystoscopy was performed to check the urethra and bladder.  MICRODOSIMETRY: At the end of the procedure, the patient was emitting 0.083 mR/hr at 1 meter. Accordingly, he was considered safe for hospital discharge.  PLAN: The patient will return to the radiation oncology clinic for post implant CT dosimetry in three weeks.   ________________________________  Sheral Apley Tammi Klippel, M.D.

## 2021-01-28 NOTE — Telephone Encounter (Signed)
Patient called wanting to inform that he has been taking Cialis '5mg'$  daily from his PCP to help with his frequency. Since he had Brachy yesterday he wanted to make sure it was ok to continue on this medication. He is not having any urinary/ voiding symptoms while taking. Patient confirmed that he is no longer taking Uroxatrol, this was a temporary 30 day course that he has completed. This was discontinued from his medication list. Patient does not need a prescription sent in he just would like to make sure it is ok to continue with his current prostate cancer therapy.

## 2021-01-28 NOTE — Telephone Encounter (Signed)
RETURNED PATIENT'S PHONE CALL, SPOKE WITH PATIENT. ?

## 2021-02-03 NOTE — Telephone Encounter (Signed)
Patient called and notified.

## 2021-02-14 ENCOUNTER — Telehealth: Payer: Self-pay | Admitting: *Deleted

## 2021-02-14 NOTE — Telephone Encounter (Signed)
Called patient to remind of appts. for 02-15-21, spoke with patient and he is aware of these appts.

## 2021-02-15 ENCOUNTER — Ambulatory Visit
Admission: RE | Admit: 2021-02-15 | Discharge: 2021-02-15 | Disposition: A | Discharge status: Home / Self Care | Payer: BC Managed Care – PPO | Source: Ambulatory Visit | Attending: Urology | Admitting: Urology

## 2021-02-15 ENCOUNTER — Ambulatory Visit
Admission: RE | Admit: 2021-02-15 | Discharge: 2021-02-15 | Disposition: A | Discharge status: Home / Self Care | Payer: BC Managed Care – PPO | Source: Ambulatory Visit | Attending: Radiation Oncology | Admitting: Radiation Oncology

## 2021-02-15 ENCOUNTER — Other Ambulatory Visit: Payer: Self-pay | Admitting: Urology

## 2021-02-15 ENCOUNTER — Other Ambulatory Visit: Payer: Self-pay

## 2021-02-15 ENCOUNTER — Encounter: Payer: Self-pay | Admitting: Urology

## 2021-02-15 DIAGNOSIS — C61 Malignant neoplasm of prostate: Secondary | ICD-10-CM

## 2021-02-15 DIAGNOSIS — Z923 Personal history of irradiation: Secondary | ICD-10-CM | POA: Insufficient documentation

## 2021-02-15 DIAGNOSIS — Z79899 Other long term (current) drug therapy: Secondary | ICD-10-CM | POA: Diagnosis not present

## 2021-02-15 DIAGNOSIS — Z510 Encounter for antineoplastic radiation therapy: Secondary | ICD-10-CM | POA: Insufficient documentation

## 2021-02-15 DIAGNOSIS — R351 Nocturia: Secondary | ICD-10-CM | POA: Insufficient documentation

## 2021-02-15 DIAGNOSIS — R3912 Poor urinary stream: Secondary | ICD-10-CM | POA: Insufficient documentation

## 2021-02-15 MED ORDER — TAMSULOSIN HCL 0.4 MG PO CAPS
0.4000 mg | ORAL_CAPSULE | Freq: Every day | ORAL | 2 refills | Status: DC
Start: 2021-02-15 — End: 2023-07-17

## 2021-02-15 NOTE — Progress Notes (Signed)
Radiation Oncology         [phone]) [phone] ________________________________  Name: Logan Lowery MRN: [id number]  Date: 02/15/2021  DOB: 10-02-1955  Post-Seed Follow-Up Visit Note  CC: Neale Burly, MD  Franchot Gallo, MD  Diagnosis:    65 y.o. gentleman with stage T1c adenocarcinoma of the prostate with a Gleason's score of 3+4 and a PSA of 6    ICD-10-CM   1. Malignant neoplasm of prostate ([place])  C61       Interval Since Last Radiation:  2.5 weeks 01/27/21:  Insertion of radioactive I-125 seeds into the prostate gland; 145 Gy, definitive therapy with placement of SpaceOAR gel.  Narrative:  The patient returns today for routine follow-up.  He is complaining of increased urinary frequency and urinary hesitation symptoms. He filled out a questionnaire regarding urinary function today providing and overall IPSS score of 20 characterizing his symptoms as moderate-severe with nocturia x3, frequency, urgency, hesitancy, intermittency and weak stream.  His pre-implant score was 1. He denies any abdominal pain or bowel symptoms. He is using Ex-lax prn for constipation. He reports a healthy appetite and is maintaining his weight. Overall, he is pleased with his progress to date.  ALLERGIES:  has No Known Allergies.  Meds: Current Outpatient Medications  Medication Sig Dispense Refill   amLODipine (NORVASC) 10 MG tablet Take 10 mg by mouth daily.      atorvastatin (LIPITOR) 40 MG tablet Take 40 mg by mouth daily.      CLARITIN 10 MG CAPS Take 10 mg by mouth as needed.     hydrocortisone 2.5 % cream Apply topically as needed.     montelukast (SINGULAIR) 10 MG tablet Take 10 mg by mouth daily as needed.     tadalafil (CIALIS) 5 MG tablet Take 5 mg by mouth daily as needed for erectile dysfunction.     tamsulosin (FLOMAX) 0.4 MG CAPS capsule Take 1 capsule (0.4 mg total) by mouth daily after supper. 30 capsule 2   No current facility-administered medications for this encounter.     Physical Findings: In general this is a well appearing African American male in no acute distress. He's alert and oriented x4 and appropriate throughout the examination. Cardiopulmonary assessment is negative for acute distress and he exhibits normal effort.   Lab Findings: Lab Results  Component Value Date   WBC 6.0 01/24/2021   HGB 15.3 01/24/2021   HCT 45.1 01/24/2021   MCV 89.3 01/24/2021   PLT 241 01/24/2021    Radiographic Findings:  Patient underwent CT imaging in our clinic for post implant dosimetry. The CT will be reviewed by Dr. Tammi Klippel to confirm there is an adequate distribution of radioactive seeds throughout the prostate gland and ensure that there are no seeds in or near the rectum. We suspect the final radiation plan and dosimetry will show appropriate coverage of the prostate gland. He understands that we will call and inform him of any unexpected findings on further review of his imaging and dosimetry.  Impression/Plan:   65 y.o. gentleman with stage T1c adenocarcinoma of the prostate with a Gleason's score of 3+4 and a PSA of 6 The patient is recovering from the effects of radiation. His urinary symptoms should gradually improve over the next 4-6 months. We talked about this today.  I have sent a prescription for Flomax 0/'4mg'$  po qhs to help manage his LUTS. He is encouraged by his improvement already and is otherwise pleased with his outcome. We also talked about long-term  follow-up for prostate cancer following seed implant. He understands that ongoing PSA determinations and digital rectal exams will help perform surveillance to rule out disease recurrence. He has a follow up appointment scheduled with Dr. Diona Fanti in October 2022. He understands what to expect with his PSA measures. Patient was also educated today about some of the long-term effects from radiation including a small risk for rectal bleeding and possibly erectile dysfunction. We talked about some of the  general management approaches to these potential complications. However, I did encourage the patient to contact our office or return at any point if he has questions or concerns related to his previous radiation and prostate cancer.    Nicholos Johns, PA-C

## 2021-02-15 NOTE — Progress Notes (Signed)
  Radiation Oncology         [phone]) [phone] ________________________________  Name: Cole Furrh MRN: [id number]  Date: 02/15/2021  DOB: [birth year]  COMPLEX SIMULATION NOTE  NARRATIVE:  The patient was brought to the [facility] today following prostate seed implantation approximately one month ago.  Identity was confirmed.  All relevant records and images related to the planned course of therapy were reviewed.  Then, the patient was set-up supine.  CT images were obtained.  The CT images were loaded into the planning software.  Then the prostate and rectum were contoured.  Treatment planning then occurred.  The implanted iodine 125 seeds were identified by the physics staff for projection of radiation distribution  I have requested : 3D Simulation  I have requested a DVH of the following structures: Prostate and rectum.    ________________________________  Sheral Apley Tammi Klippel, M.D.

## 2021-02-15 NOTE — Progress Notes (Signed)
Patient in for post seed follow up appointment. Patient reports incomplete bladder emptying, frequency, intermittency urgency and weak urine stream about half of the time. Patient reports having to strain or push to start urination less than half the time. Patient reports having nocturia, getting up three times per night.Patient has follow up appointment with urologist in October.  Patients IPSS score is 20.

## 2021-02-24 ENCOUNTER — Telehealth: Payer: Self-pay

## 2021-02-24 NOTE — Telephone Encounter (Signed)
-----   Message from Raiford Noble sent at 02/23/2021  9:38 AM EDT ----- Regarding: FW:  ----- Message ----- From: Franchot Gallo, MD Sent: 02/23/2021   9:29 AM EDT To: Raiford Noble Subject: RE:                                            Go to [employer]. She has both forms. I signed them both she just Hass to fill them in. ----- Message ----- From: Raiford Noble Sent: 02/23/2021   9:21 AM EDT To: Franchot Gallo, MD  Hey! He called this morning and wanted me to you know that he went back to work on August 2. He said you were working on a letter for him. Thanks.

## 2021-02-24 NOTE — Telephone Encounter (Signed)
FMLA paperwork completed and faxed to [phone]  Return to work letter completed and faxed to [phone]

## 2021-03-11 ENCOUNTER — Ambulatory Visit
Admission: RE | Admit: 2021-03-11 | Discharge: 2021-03-11 | Disposition: A | Discharge status: Home / Self Care | Payer: BC Managed Care – PPO | Source: Ambulatory Visit | Attending: Radiation Oncology | Admitting: Radiation Oncology

## 2021-03-11 ENCOUNTER — Encounter: Payer: Self-pay | Admitting: Radiation Oncology

## 2021-03-11 DIAGNOSIS — C61 Malignant neoplasm of prostate: Secondary | ICD-10-CM | POA: Insufficient documentation

## 2021-03-14 NOTE — Progress Notes (Signed)
  Radiation Oncology         [phone]) [phone] ________________________________  Name: Logan Lowery MRN: [id number]  Date: 03/11/2021  DOB: 11/12/1955  3D Planning Note   Prostate Brachytherapy Post-Implant Dosimetry  Diagnosis: 65 y.o. gentleman with stage T1c adenocarcinoma of the prostate with a Gleason's score of 3+4 and a PSA of 6  Narrative: On a previous date, [name] returned following prostate seed implantation for post implant planning. He underwent CT scan complex simulation to delineate the three-dimensional structures of the pelvis and demonstrate the radiation distribution.  Since that time, the seed localization, and complex isodose planning with dose volume histograms have now been completed.  Results:   Prostate Coverage - The dose of radiation delivered to the 90% or more of the prostate gland (D90) was 107.11% of the prescription dose. This exceeds our goal of greater than 90%. Rectal Sparing - The volume of rectal tissue receiving the prescription dose or higher was 0.0 cc. This falls under our thresholds tolerance of 1.0 cc.  Impression: The prostate seed implant appears to show adequate target coverage and appropriate rectal sparing.  Plan:  The patient will continue to follow with urology for ongoing PSA determinations. I would anticipate a high likelihood for local tumor control with minimal risk for rectal morbidity.  ________________________________  Sheral Apley Tammi Klippel, M.D.

## 2021-04-04 ENCOUNTER — Telehealth: Payer: Self-pay | Admitting: Adult Health

## 2021-04-04 NOTE — Telephone Encounter (Signed)
Called and reviewed referral for SCP visit following prostate cancer care.  He understands and is willing to proceed with phone visit with [employer].  Schedule message sent for him to receive an October appt.   Wilber Bihari, NP

## 2021-04-13 ENCOUNTER — Telehealth: Payer: Self-pay | Admitting: Adult Health

## 2021-04-13 NOTE — Telephone Encounter (Signed)
Scheduled per sch msg. Called and left msg  

## 2021-04-25 ENCOUNTER — Inpatient Hospital Stay: Payer: BC Managed Care – PPO | Attending: Radiation Oncology | Admitting: *Deleted

## 2021-04-25 ENCOUNTER — Encounter: Payer: Self-pay | Admitting: *Deleted

## 2021-04-25 DIAGNOSIS — C61 Malignant neoplasm of prostate: Secondary | ICD-10-CM

## 2021-04-25 NOTE — Progress Notes (Signed)
Survivorship assessment completed. SDOH completed. No barriers or needs at this time. Pt is up to date with colon screening and vaccinations.

## 2021-04-26 ENCOUNTER — Ambulatory Visit: Payer: BC Managed Care – PPO | Admitting: Urology

## 2021-04-26 DIAGNOSIS — R972 Elevated prostate specific antigen [PSA]: Secondary | ICD-10-CM

## 2021-04-26 DIAGNOSIS — N138 Other obstructive and reflux uropathy: Secondary | ICD-10-CM

## 2021-06-14 ENCOUNTER — Encounter: Payer: Self-pay | Admitting: Urology

## 2021-06-14 ENCOUNTER — Ambulatory Visit (INDEPENDENT_AMBULATORY_CARE_PROVIDER_SITE_OTHER): Payer: BC Managed Care – PPO | Admitting: Urology

## 2021-06-14 ENCOUNTER — Other Ambulatory Visit: Payer: Self-pay

## 2021-06-14 VITALS — BP 152/88 | HR 99

## 2021-06-14 DIAGNOSIS — C61 Malignant neoplasm of prostate: Secondary | ICD-10-CM

## 2021-06-14 DIAGNOSIS — N401 Enlarged prostate with lower urinary tract symptoms: Secondary | ICD-10-CM | POA: Diagnosis not present

## 2021-06-14 DIAGNOSIS — N138 Other obstructive and reflux uropathy: Secondary | ICD-10-CM

## 2021-06-14 DIAGNOSIS — R972 Elevated prostate specific antigen [PSA]: Secondary | ICD-10-CM

## 2021-06-14 LAB — URINALYSIS, ROUTINE W REFLEX MICROSCOPIC
Bilirubin, UA: NEGATIVE
Glucose, UA: NEGATIVE
Ketones, UA: NEGATIVE
Leukocytes,UA: NEGATIVE
Nitrite, UA: NEGATIVE
Protein,UA: NEGATIVE
RBC, UA: NEGATIVE
Specific Gravity, UA: 1.02 (ref 1.005–1.030)
Urobilinogen, Ur: 0.2 mg/dL (ref 0.2–1.0)
pH, UA: 7.5 (ref 5.0–7.5)

## 2021-06-14 NOTE — Progress Notes (Signed)
Urological Symptom Review  Patient is experiencing the following symptoms: Frequent urination Get up at night to urinate Weak stream   Review of Systems  Gastrointestinal (upper)  : Negative for upper GI symptoms  Gastrointestinal (lower) : Negative for lower GI symptoms  Constitutional : Negative for symptoms  Skin: Itching  Eyes: Negative for eye symptoms  Ear/Nose/Throat : Negative for Ear/Nose/Throat symptoms  Hematologic/Lymphatic: Negative for Hematologic/Lymphatic symptoms  Cardiovascular : Negative for cardiovascular symptoms  Respiratory : Negative for respiratory symptoms  Endocrine: Negative for endocrine symptoms  Musculoskeletal: Negative for musculoskeletal symptoms  Neurological: Negative for neurological symptoms  Psychologic: Negative for psychiatric symptoms

## 2021-06-14 NOTE — Progress Notes (Signed)
History of Present Illness: Here for follow-up of prostate cancer.  TRUS/Bx on 3.22.2022. PSA 6  Prostate volume 31 mL, PSAD 0.19. 5 cores revealed GG2 PCA- Lt apex lateral (5% involvement), Rt apex lateral (70%), Lt apex medial (5%), Lt mid lateral (20%), Lt mid medial (5%)  7.21.2022: I-125 brachytherapy and placement of SpaceOAR  12.6.2022: He was started on tamsulosin by Salley Scarlet, PA for urinary symptoms following his brachytherapy.  He is tolerating the medicine well and has mild to moderate symptomatology.  IPSS 13, quality-of-life score 2.  He denies blood in his urine or stool.    Past Medical History:  Diagnosis Date   COVID 07/17/2019   cough x 14 days all symptoms resolved result in care everywhere   Hypercholesteremia    Hypertension    Prostate cancer ([place])    Wears dentures 01/20/2021   upper   Wears glasses 01/20/2021    Past Surgical History:  Procedure Laterality Date   COLONOSCOPY WITH PROPOFOL N/A 05/24/2020   Procedure: COLONOSCOPY WITH PROPOFOL;  Surgeon: Eloise Harman, DO;  [facility];  Service: Endoscopy;  Laterality: N/A;  Surveillance-10:45am   CYSTOSCOPY N/A 01/27/2021   Procedure: CYSTOSCOPY FLEXIBLE;  Surgeon: Franchot Gallo, MD;  Location[facility];  Service: Urology;  Laterality: N/A;  NO SEEDS FOUND IN BLADDER   POLYPECTOMY  05/24/2020   Procedure: POLYPECTOMY;  Surgeon: Eloise Harman, DO;  [facility];  Service: Endoscopy;;   RADIOACTIVE SEED IMPLANT N/A 01/27/2021   Procedure: RADIOACTIVE SEED IMPLANT/BRACHYTHERAPY IMPLANT;  Surgeon: Franchot Gallo, MD;  Location: [facility];  Service: Urology;  Laterality: N/A;  65  SEEDS IMPLANTED   SPACE OAR INSTILLATION N/A 01/27/2021   Procedure: SPACE OAR INSTILLATION;  Surgeon: Franchot Gallo, MD;  Location[facility];  Service: Urology;  Laterality: N/A;    Home Medications:  Allergies as of 06/14/2021    No Known Allergies      Medication List        Accurate as of June 14, 2021  8:20 AM. If you have any questions, ask your nurse or doctor.          amLODipine 10 MG tablet Commonly known as: NORVASC Take 10 mg by mouth daily.   atorvastatin 40 MG tablet Commonly known as: LIPITOR Take 40 mg by mouth daily.   Claritin 10 MG Caps Generic drug: Loratadine Take 10 mg by mouth as needed.   montelukast 10 MG tablet Commonly known as: SINGULAIR Take 10 mg by mouth daily as needed.   tadalafil 5 MG tablet Commonly known as: CIALIS Take 5 mg by mouth daily as needed for erectile dysfunction.   tamsulosin 0.4 MG Caps capsule Commonly known as: FLOMAX Take 1 capsule (0.4 mg total) by mouth daily after supper.        Allergies: No Known Allergies  Family History  Problem Relation Age of Onset   Prostate cancer Brother    Breast cancer Neg Hx    Colon cancer Neg Hx    Pancreatic cancer Neg Hx     Social History:  reports that he has quit smoking. His smoking use included cigarettes. He has a 3.50 pack-year smoking history. He has never used smokeless tobacco. He reports that he does not drink alcohol and does not use drugs.  ROS: A complete review of systems was performed.  All systems are negative except for pertinent findings as noted.  Physical Exam:  Vital signs  in last 24 hours: There were no vitals taken for this visit. Constitutional:  Alert and oriented, No acute distress Cardiovascular: Regular rate  Respiratory: Normal respiratory effor.  Genitourinary: Normal anal sphincter tone.  Prostate is smooth, flat.  No significant rectal mucosal abnormalities. Lymphatic: No lymphadenopathy Neurologic: Grossly intact, no focal deficits Psychiatric: Normal mood and affect  I have reviewed prior pt notes  I have reviewed notes from referring/previous physicians  I have reviewed urinalysis results  I have reviewed prior PSA results  I have reviewed  prior urine culture   Impression/Assessment:  GG 2 adenocarcinoma the prostate, status post I-125 brachytherapy/SpaceOAR in July 2022.  Overall doing fairly well with medical therapy for urinary tract symptoms.  Plan:  I will check his PSA today  Continue tamsulosin  I will see back in about 4 months following PSA

## 2021-06-15 LAB — PSA: Prostate Specific Ag, Serum: 0.5 ng/mL (ref 0.0–4.0)

## 2021-10-11 ENCOUNTER — Ambulatory Visit (INDEPENDENT_AMBULATORY_CARE_PROVIDER_SITE_OTHER): Payer: Medicare Other | Admitting: Urology

## 2021-10-11 ENCOUNTER — Encounter: Payer: Self-pay | Admitting: Urology

## 2021-10-11 VITALS — BP 148/92 | HR 81

## 2021-10-11 DIAGNOSIS — C61 Malignant neoplasm of prostate: Secondary | ICD-10-CM | POA: Diagnosis not present

## 2021-10-11 DIAGNOSIS — N401 Enlarged prostate with lower urinary tract symptoms: Secondary | ICD-10-CM | POA: Diagnosis not present

## 2021-10-11 DIAGNOSIS — N138 Other obstructive and reflux uropathy: Secondary | ICD-10-CM | POA: Diagnosis not present

## 2021-10-11 NOTE — Progress Notes (Signed)
History of Present Illness:  Here for follow-up of prostate cancer. ? ?TRUS/Bx on 3.22.2022. PSA 6  Prostate volume 31 mL, PSAD 0.19. ?5 cores revealed GG2 PCA- ?Lt apex lateral (5% involvement), Rt apex lateral (70%), Lt apex medial (5%), Lt mid lateral (20%), Lt mid medial (5%) ?  ?7.21.2022: I-125 brachytherapy and placement of SpaceOAR ? ?4.4.2023: PSA last visit 0.5. IPSS 13   QoL score 2, on flomax. ?No blood in urine or stool. ? ? ? ?Past Medical History:  ?Diagnosis Date  ? COVID 07/17/2019  ? cough x 14 days all symptoms resolved result in care everywhere  ? Hypercholesteremia   ? Hypertension   ? Prostate cancer ([place])   ? Wears dentures 01/20/2021  ? upper  ? Wears glasses 01/20/2021  ? ? ?Past Surgical History:  ?Procedure Laterality Date  ? COLONOSCOPY WITH PROPOFOL N/A 05/24/2020  ? Procedure: COLONOSCOPY WITH PROPOFOL;  Surgeon: Eloise Harman, DO;  [facility];  Service: Endoscopy;  Laterality: N/A;  Surveillance-10:45am  ? CYSTOSCOPY N/A 01/27/2021  ? Procedure: CYSTOSCOPY FLEXIBLE;  Surgeon: Franchot Gallo, MD;  Location: [facility];  Service: Urology;  Laterality: N/A;  NO SEEDS FOUND IN BLADDER  ? POLYPECTOMY  05/24/2020  ? Procedure: POLYPECTOMY;  Surgeon: Eloise Harman, DO;  [facility];  Service: Endoscopy;;  ? RADIOACTIVE SEED IMPLANT N/A 01/27/2021  ? Procedure: RADIOACTIVE SEED IMPLANT/BRACHYTHERAPY IMPLANT;  Surgeon: Franchot Gallo, MD;  Location: [facility];  Service: Urology;  Laterality: N/A;  64  SEEDS IMPLANTED  ? SPACE OAR INSTILLATION N/A 01/27/2021  ? Procedure: SPACE OAR INSTILLATION;  Surgeon: Franchot Gallo, MD;  Location: [facility];  Service: Urology;  Laterality: N/A;  ? ? ?Home Medications:  ?Allergies as of 10/11/2021   ?No Known Allergies ?  ? ?  ?Medication List  ?  ? ?  ? Accurate as of October 11, 2021 12:28 PM. If you have any questions, ask your nurse or doctor.  ?  ?  ? ?  ? ?amLODipine  10 MG tablet ?Commonly known as: NORVASC ?Take 10 mg by mouth daily. ?  ?atorvastatin 40 MG tablet ?Commonly known as: LIPITOR ?Take 40 mg by mouth daily. ?  ?Claritin 10 MG Caps ?Generic drug: Loratadine ?Take 10 mg by mouth as needed. ?  ?montelukast 10 MG tablet ?Commonly known as: SINGULAIR ?Take 10 mg by mouth daily as needed. ?  ?tadalafil 5 MG tablet ?Commonly known as: CIALIS ?Take 5 mg by mouth daily as needed for erectile dysfunction. ?  ?tamsulosin 0.4 MG Caps capsule ?Commonly known as: FLOMAX ?Take 1 capsule (0.4 mg total) by mouth daily after supper. ?  ? ?  ? ? ?Allergies: No Known Allergies ? ?Family History  ?Problem Relation Age of Onset  ? Prostate cancer Brother   ? Breast cancer Neg Hx   ? Colon cancer Neg Hx   ? Pancreatic cancer Neg Hx   ? ? ?Social History:  reports that he has quit smoking. His smoking use included cigarettes. He has a 3.50 pack-year smoking history. He has never used smokeless tobacco. He reports that he does not drink alcohol and does not use drugs. ? ?ROS: ?A complete review of systems was performed.  All systems are negative except for pertinent findings as noted. ? ?Physical Exam:  ?Vital signs in last 24 hours: ?There were no vitals taken for this visit. ?Constitutional:  Alert and oriented, No acute distress ?Cardiovascular: Regular rate  ?  Respiratory: Normal respiratory effort ?Neurologic: Grossly intact, no focal deficits ?Psychiatric: Normal mood and affect ? ?I have reviewed prior pt notes ? ?I have reviewed urinalysis results ? ?I have independently reviewed prior imaging--U/S volume ? ?I have reviewed prior pathology/PSA results ? ? ? ? ?Impression/Assessment:  ?Favorable intermediate risk prostate cancer, status post I-125 brachytherapy less than 1 year ago.  He seems to be doing well ? ?Plan:  ?I will check his PSA today ? ?Limit caffeinated beverages, this might help his lower urinary tract symptoms ? ?I will see back in 6 months for recheck ? ?

## 2021-10-12 LAB — PSA: Prostate Specific Ag, Serum: 0.4 ng/mL (ref 0.0–4.0)

## 2021-10-14 NOTE — Progress Notes (Signed)
Letter sent.

## 2021-10-28 LAB — MICROSCOPIC EXAMINATION
Bacteria, UA: NONE SEEN
Epithelial Cells (non renal): NONE SEEN /hpf (ref 0–10)
RBC, Urine: NONE SEEN /hpf (ref 0–2)
Renal Epithel, UA: NONE SEEN /hpf
WBC, UA: NONE SEEN /hpf (ref 0–5)

## 2021-10-28 LAB — URINALYSIS, ROUTINE W REFLEX MICROSCOPIC
Bilirubin, UA: NEGATIVE
Glucose, UA: NEGATIVE
Ketones, UA: NEGATIVE
Leukocytes,UA: NEGATIVE
Nitrite, UA: NEGATIVE
Protein,UA: NEGATIVE
Specific Gravity, UA: 1.02 (ref 1.005–1.030)
Urobilinogen, Ur: 0.2 mg/dL (ref 0.2–1.0)
pH, UA: 7 (ref 5.0–7.5)

## 2022-04-11 ENCOUNTER — Ambulatory Visit (INDEPENDENT_AMBULATORY_CARE_PROVIDER_SITE_OTHER): Payer: Medicare Other | Admitting: Urology

## 2022-04-11 ENCOUNTER — Encounter: Payer: Self-pay | Admitting: Urology

## 2022-04-11 VITALS — BP 127/84 | HR 80

## 2022-04-11 DIAGNOSIS — N401 Enlarged prostate with lower urinary tract symptoms: Secondary | ICD-10-CM | POA: Diagnosis not present

## 2022-04-11 DIAGNOSIS — N138 Other obstructive and reflux uropathy: Secondary | ICD-10-CM | POA: Diagnosis not present

## 2022-04-11 DIAGNOSIS — C61 Malignant neoplasm of prostate: Secondary | ICD-10-CM | POA: Diagnosis not present

## 2022-04-11 NOTE — Progress Notes (Addendum)
Opened by mistake.

## 2022-04-12 LAB — URINALYSIS, ROUTINE W REFLEX MICROSCOPIC
Bilirubin, UA: NEGATIVE
Glucose, UA: NEGATIVE
Leukocytes,UA: NEGATIVE
Nitrite, UA: NEGATIVE
Protein,UA: NEGATIVE
RBC, UA: NEGATIVE
Specific Gravity, UA: 1.02 (ref 1.005–1.030)
Urobilinogen, Ur: 0.2 mg/dL (ref 0.2–1.0)
pH, UA: 7 (ref 5.0–7.5)

## 2022-04-12 LAB — PSA: Prostate Specific Ag, Serum: 0.4 ng/mL (ref 0.0–4.0)

## 2022-04-13 ENCOUNTER — Telehealth: Payer: Self-pay

## 2022-04-13 NOTE — Telephone Encounter (Signed)
-----   Message from Franchot Gallo, MD sent at 04/13/2022 12:45 PM EDT ----- Let pt know that PSA normal/low @ 0.4 ----- Message ----- From: Audie Box, CMA Sent: 04/12/2022   4:56 PM EDT To: Franchot Gallo, MD  Please review.

## 2022-04-13 NOTE — Telephone Encounter (Signed)
Patient aware of MD response to PSA results

## 2022-10-16 NOTE — Progress Notes (Signed)
History of Present Illness:   Here for follow-up of prostate cancer.  TRUS/Bx on 3.22.2022. PSA 6  Prostate volume 31 mL, PSAD 0.19. 5 cores revealed GG2 PCA- Lt apex lateral (5% involvement), Rt apex lateral (70%), Lt apex medial (5%), Lt mid lateral (20%), Lt mid medial (5%)   7.21.2022: I-125 brachytherapy and placement of SpaceOAR   4.4.2023: PSA 0.4 10.3.2023: PSA 0.4  4.9.2024: No recent PSA. IPSS 14  QoL score 2 No blood in urine.   Past Medical History:  Diagnosis Date   COVID 07/17/2019   cough x 14 days all symptoms resolved result in care everywhere   Hypercholesteremia    Hypertension    Prostate cancer ([facility])    Wears dentures 01/20/2021   upper   Wears glasses 01/20/2021    Past Surgical History:  Procedure Laterality Date   COLONOSCOPY WITH PROPOFOL N/A 05/24/2020   Procedure: COLONOSCOPY WITH PROPOFOL;  Surgeon: Lanelle Bal, DO;  [facility];  Service: Endoscopy;  Laterality: N/A;  Surveillance-10:45am   CYSTOSCOPY N/A 01/27/2021   Procedure: CYSTOSCOPY FLEXIBLE;  Surgeon: Marcine Matar, MD;  Location: [facility];  Service: Urology;  Laterality: N/A;  NO SEEDS FOUND IN BLADDER   POLYPECTOMY  05/24/2020   Procedure: POLYPECTOMY;  Surgeon: Lanelle Bal, DO;  [facility];  Service: Endoscopy;;   RADIOACTIVE SEED IMPLANT N/A 01/27/2021   Procedure: RADIOACTIVE SEED IMPLANT/BRACHYTHERAPY IMPLANT;  Surgeon: Marcine Matar, MD;  Location: [facility];  Service: Urology;  Laterality: N/A;  64  SEEDS IMPLANTED   SPACE OAR INSTILLATION N/A 01/27/2021   Procedure: SPACE OAR INSTILLATION;  Surgeon: Marcine Matar, MD;  Location: [facility];  Service: Urology;  Laterality: N/A;    Home Medications:  Allergies as of 10/17/2022   No Known Allergies      Medication List        Accurate as of October 16, 2022  8:31 PM. If you have any questions, ask your nurse or doctor.           amLODipine 10 MG tablet Commonly known as: NORVASC Take 10 mg by mouth daily.   atorvastatin 40 MG tablet Commonly known as: LIPITOR Take 40 mg by mouth daily.   Claritin 10 MG Caps Generic drug: Loratadine Take 10 mg by mouth as needed.   montelukast 10 MG tablet Commonly known as: SINGULAIR Take 10 mg by mouth daily as needed.   tadalafil 5 MG tablet Commonly known as: CIALIS Take 5 mg by mouth daily as needed for erectile dysfunction.   tamsulosin 0.4 MG Caps capsule Commonly known as: FLOMAX Take 1 capsule (0.4 mg total) by mouth daily after supper.        Allergies: No Known Allergies  Family History  Problem Relation Age of Onset   Prostate cancer Brother    Breast cancer Neg Hx    Colon cancer Neg Hx    Pancreatic cancer Neg Hx     Social History:  reports that he has quit smoking. His smoking use included cigarettes. He has a 3.50 pack-year smoking history. He has never used smokeless tobacco. He reports that he does not drink alcohol and does not use drugs.  ROS: A complete review of systems was performed.  All systems are negative except for pertinent findings as noted.  Physical Exam:  Vital signs in last 24 hours: There were no vitals taken for this visit. Constitutional:  Alert and oriented, No acute distress  Cardiovascular: Regular rate  Respiratory: Normal respiratory effort Neurologic: Grossly intact, no focal deficits Psychiatric: Normal mood and affect  I have reviewed prior pt notes  I have reviewed urinalysis results  I have independently reviewed prior imaging  I have reviewed prior PSA and pathology results   IPSS score sheet reviewed    Impression/Assessment:  Grade group 1, high-volume prostate cancer, 3 weeks out from I-125 brachytherapy and SpaceOAR, doing well  Plan:  I will have him come back in a months following PSA for his next visit

## 2022-10-17 ENCOUNTER — Encounter: Payer: Self-pay | Admitting: Urology

## 2022-10-17 ENCOUNTER — Ambulatory Visit (INDEPENDENT_AMBULATORY_CARE_PROVIDER_SITE_OTHER): Payer: Medicare Other | Admitting: Urology

## 2022-10-17 VITALS — BP 158/98 | HR 103

## 2022-10-17 DIAGNOSIS — C61 Malignant neoplasm of prostate: Secondary | ICD-10-CM | POA: Diagnosis not present

## 2022-10-17 DIAGNOSIS — Z8546 Personal history of malignant neoplasm of prostate: Secondary | ICD-10-CM

## 2022-10-17 DIAGNOSIS — N138 Other obstructive and reflux uropathy: Secondary | ICD-10-CM

## 2022-10-17 LAB — URINALYSIS, ROUTINE W REFLEX MICROSCOPIC
Bilirubin, UA: NEGATIVE
Glucose, UA: NEGATIVE
Ketones, UA: NEGATIVE
Leukocytes,UA: NEGATIVE
Nitrite, UA: NEGATIVE
Protein,UA: NEGATIVE
RBC, UA: NEGATIVE
Specific Gravity, UA: 1.01 (ref 1.005–1.030)
Urobilinogen, Ur: 0.2 mg/dL (ref 0.2–1.0)
pH, UA: 6 (ref 5.0–7.5)

## 2022-10-18 LAB — PSA: Prostate Specific Ag, Serum: 0.3 ng/mL (ref 0.0–4.0)

## 2022-10-20 ENCOUNTER — Telehealth: Payer: Self-pay

## 2022-10-20 IMAGING — DX DG CHEST 2V
2 series · 2 of 2 positions shown · non-contrast
Comparison: None.

CLINICAL DATA: Preop seed implant.

EXAM:
CHEST - 2 VIEW

[chest pa]
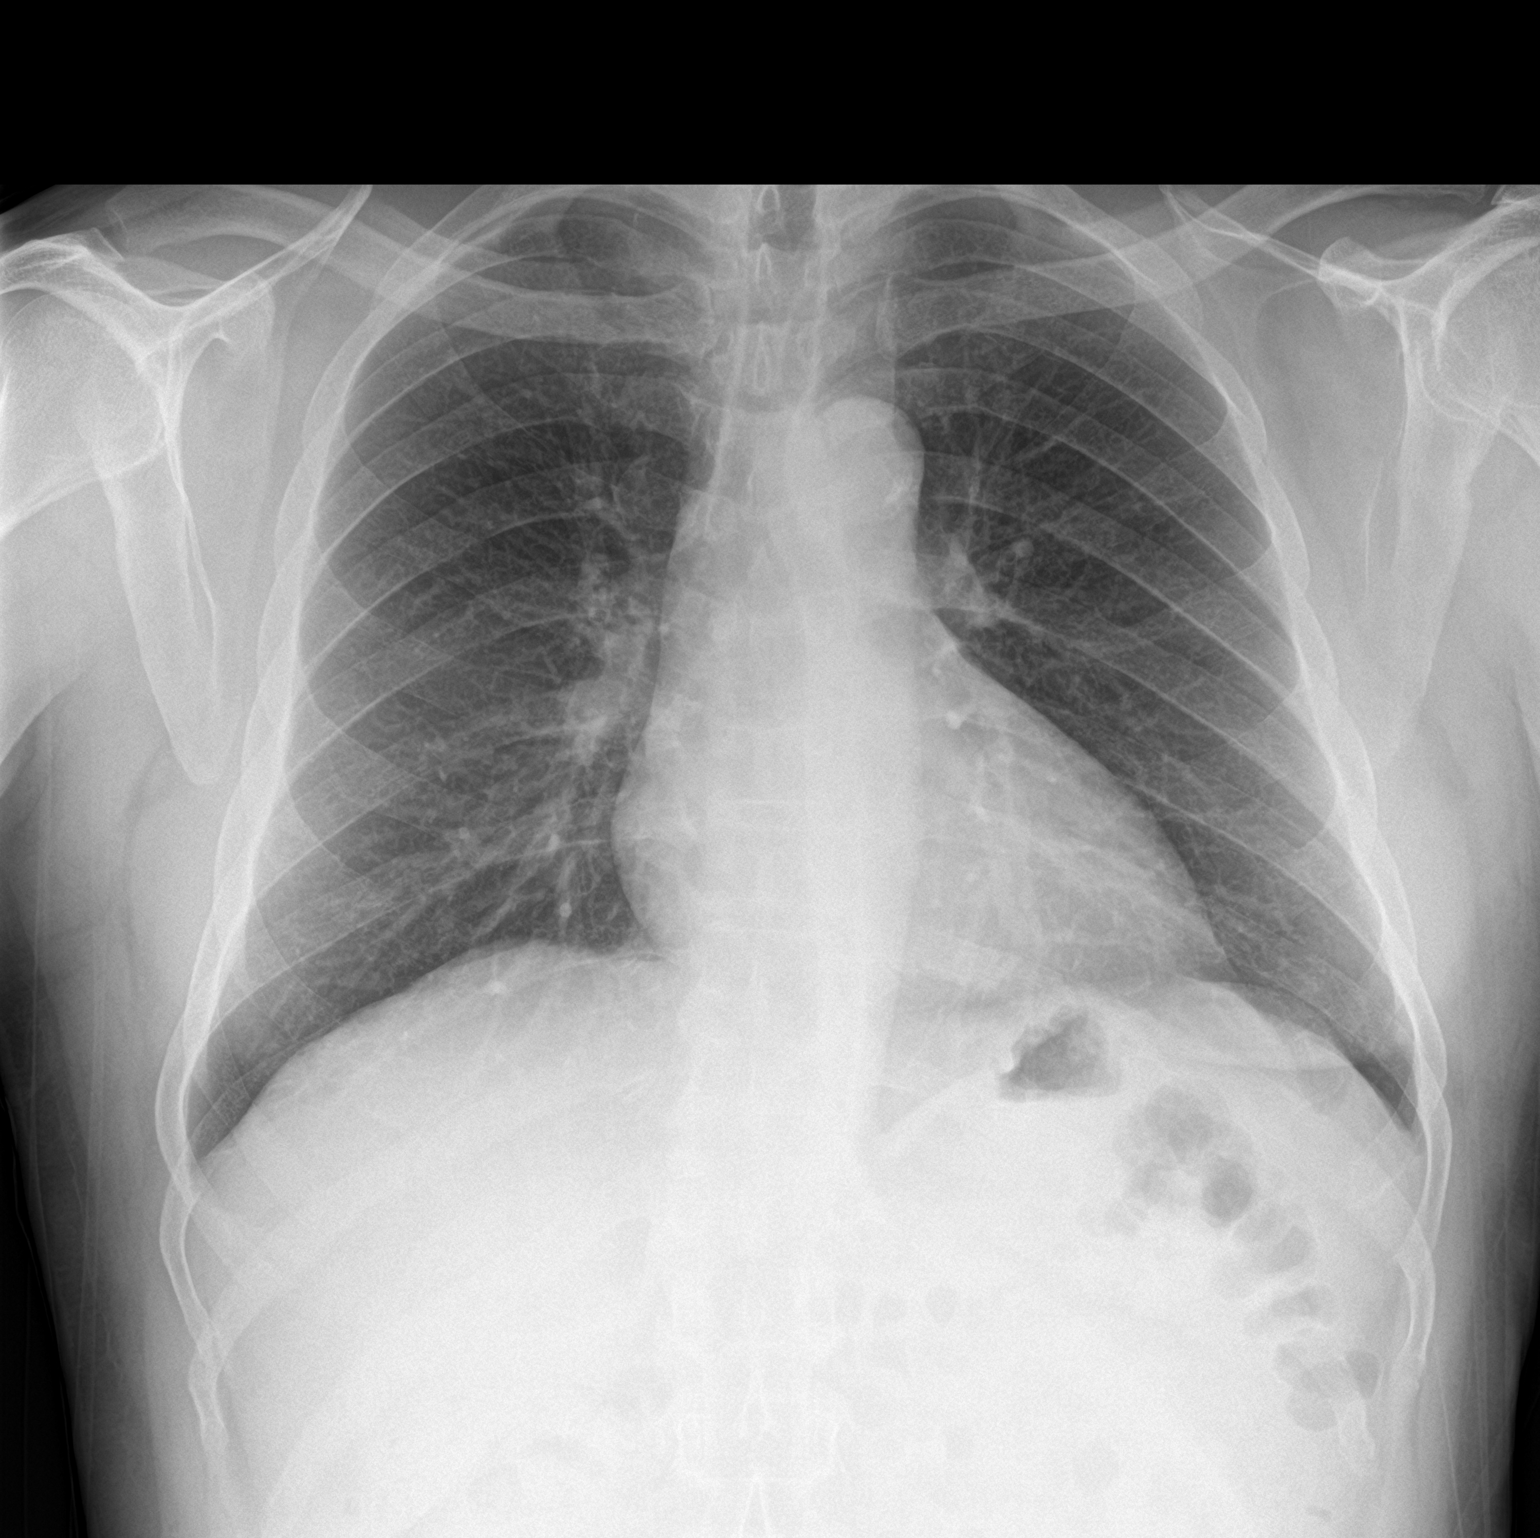

[chest lat]
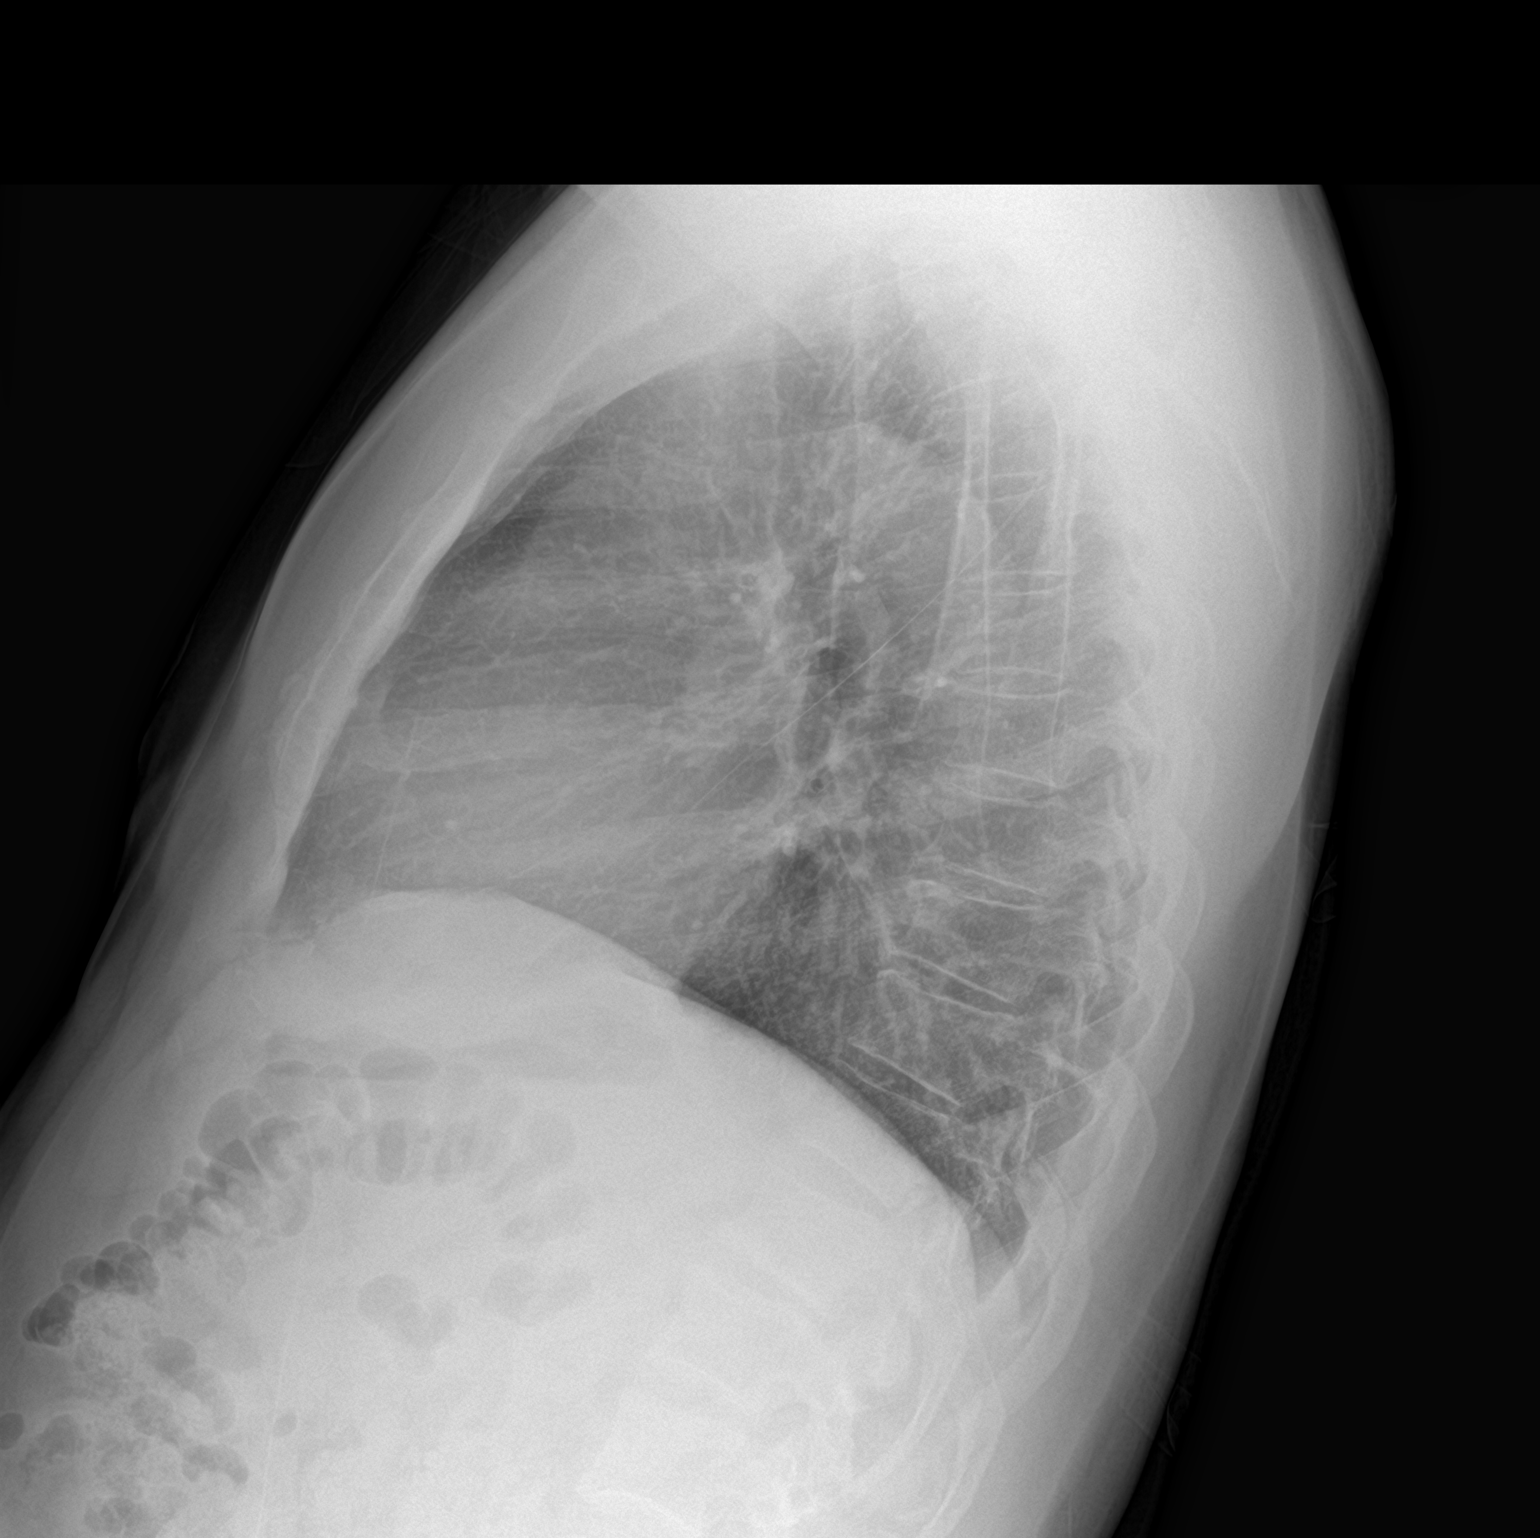

[2 of 2 positions shown; findings below may reference images not displayed]

FINDINGS: The cardiomediastinal contours are normal. Minimal aortic
atherosclerosis. The lungs are clear. Pulmonary vasculature is
normal. No consolidation, pleural effusion, or pneumothorax. No
acute osseous abnormalities are seen.
IMPRESSION: Unremarkable radiographs of the chest.

Aortic Atherosclerosis ([id number]).

## 2022-10-20 NOTE — Telephone Encounter (Signed)
Patient is aware of Md recommendation and voiced understanding

## 2022-10-20 NOTE — Telephone Encounter (Signed)
-----   Message from Marcine Matar, MD sent at 10/20/2022 12:54 PM EDT ----- Notify pt psa now 0.3 ----- Message ----- From: Troy Sine, CMA Sent: 10/18/2022   8:47 AM EDT To: Marcine Matar, MD  Please review

## 2023-07-16 NOTE — Progress Notes (Signed)
 History of Present Illness:   Here for follow-up of prostate cancer.  TRUS/Bx on 3.22.2022. PSA 6  Prostate volume 31 mL, PSAD 0.19. 5 cores revealed GG2 PCA- Lt apex lateral (5% involvement), Rt apex lateral (70%), Lt apex medial (5%), Lt mid lateral (20%), Lt mid medial (5%)   7.21.2022: I-125 brachytherapy and placement of SpaceOAR   1.7.2025: Routine check.  Since he was last here in April 2024 he denies any real urinary issues.  He was switched from Flomax  to alfuzosin  by his PCP.  He is doing well with that.  No gross hematuria, no dysuria.  Does use tadalafil for ED.  No recent PSA-0.3 back at his last visit.  Past Medical History:  Diagnosis Date   COVID 07/17/2019   cough x 14 days all symptoms resolved result in care everywhere   Hypercholesteremia    Hypertension    Prostate cancer ([facility])    Wears dentures 01/20/2021   upper   Wears glasses 01/20/2021    Past Surgical History:  Procedure Laterality Date   COLONOSCOPY WITH PROPOFOL  N/A 05/24/2020   Procedure: COLONOSCOPY WITH PROPOFOL ;  Surgeon: Cindie Carlin POUR, DO;  [facility];  Service: Endoscopy;  Laterality: N/A;  Surveillance-10:45am   CYSTOSCOPY N/A 01/27/2021   Procedure: CYSTOSCOPY FLEXIBLE;  Surgeon: Matilda Senior, MD;  Location: [facility];  Service: Urology;  Laterality: N/A;  NO SEEDS FOUND IN BLADDER   POLYPECTOMY  05/24/2020   Procedure: POLYPECTOMY;  Surgeon: Cindie Carlin POUR, DO;  [facility];  Service: Endoscopy;;   RADIOACTIVE SEED IMPLANT N/A 01/27/2021   Procedure: RADIOACTIVE SEED IMPLANT/BRACHYTHERAPY IMPLANT;  Surgeon: Matilda Senior, MD;  Location: [facility];  Service: Urology;  Laterality: N/A;  64  SEEDS IMPLANTED   SPACE OAR INSTILLATION N/A 01/27/2021   Procedure: SPACE OAR INSTILLATION;  Surgeon: Matilda Senior, MD;  Location: [facility];  Service: Urology;  Laterality: N/A;    Home Medications:   Allergies as of 07/17/2023   No Known Allergies      Medication List        Accurate as of July 16, 2023 11:14 AM. If you have any questions, ask your nurse or doctor.          amLODipine 10 MG tablet Commonly known as: NORVASC Take 10 mg by mouth daily.   atorvastatin 40 MG tablet Commonly known as: LIPITOR Take 40 mg by mouth daily.   Claritin 10 MG Caps Generic drug: Loratadine Take 10 mg by mouth as needed.   montelukast 10 MG tablet Commonly known as: SINGULAIR Take 10 mg by mouth daily as needed.   tadalafil 5 MG tablet Commonly known as: CIALIS Take 5 mg by mouth daily as needed for erectile dysfunction.   tamsulosin  0.4 MG Caps capsule Commonly known as: FLOMAX  Take 1 capsule (0.4 mg total) by mouth daily after supper.        Allergies: No Known Allergies  Family History  Problem Relation Age of Onset   Prostate cancer Brother    Breast cancer Neg Hx    Colon cancer Neg Hx    Pancreatic cancer Neg Hx     Social History:  reports that he has quit smoking. His smoking use included cigarettes. He has a 3.5 pack-year smoking history. He has never used smokeless tobacco. He reports that he does not drink alcohol and does not use drugs.  ROS: A complete review of systems was performed.  All systems  are negative except for pertinent findings as noted.  Physical Exam:  Vital signs in last 24 hours: There were no vitals taken for this visit. Constitutional:  Alert and oriented, No acute distress Cardiovascular: Regular rate  Respiratory: Normal respiratory effort Neurologic: Grossly intact, no focal deficits Psychiatric: Normal mood and affect  I have reviewed prior pt notes  I have reviewed urinalysis results  I have independently reviewed prior imaging--U/S data  I have reviewed prior PSA and pathology results     Impression/Assessment:  Grade group 2, high-volume prostate cancer, status post brachytherapy in July 2022 with excellent  PSA response thus far, no radiation related sequelae  ED, treated with tadalafil  BPH, doing well with alfuzosin   Plan:  Check his PSA today  Office visit 1 year, we will check a PSA only in 6 months

## 2023-07-17 ENCOUNTER — Ambulatory Visit (INDEPENDENT_AMBULATORY_CARE_PROVIDER_SITE_OTHER): Payer: Medicare Other | Admitting: Urology

## 2023-07-17 ENCOUNTER — Encounter: Payer: Self-pay | Admitting: Urology

## 2023-07-17 VITALS — BP 152/91 | HR 98

## 2023-07-17 DIAGNOSIS — Z8546 Personal history of malignant neoplasm of prostate: Secondary | ICD-10-CM | POA: Diagnosis not present

## 2023-07-17 MED ORDER — ALFUZOSIN HCL ER 10 MG PO TB24: 10.0000 mg | ORAL_TABLET | Freq: Every day | ORAL | Status: DC

## 2023-07-18 LAB — PSA: Prostate Specific Ag, Serum: 0.3 ng/mL (ref 0.0–4.0)

## 2023-12-25 DIAGNOSIS — Z8546 Personal history of malignant neoplasm of prostate: Secondary | ICD-10-CM | POA: Diagnosis not present

## 2023-12-25 DIAGNOSIS — E7849 Other hyperlipidemia: Secondary | ICD-10-CM | POA: Diagnosis not present

## 2023-12-25 DIAGNOSIS — Z6827 Body mass index (BMI) 27.0-27.9, adult: Secondary | ICD-10-CM | POA: Diagnosis not present

## 2023-12-25 DIAGNOSIS — J3089 Other allergic rhinitis: Secondary | ICD-10-CM | POA: Diagnosis not present

## 2023-12-25 DIAGNOSIS — N182 Chronic kidney disease, stage 2 (mild): Secondary | ICD-10-CM | POA: Diagnosis not present

## 2023-12-25 DIAGNOSIS — I10 Essential (primary) hypertension: Secondary | ICD-10-CM | POA: Diagnosis not present

## 2023-12-25 DIAGNOSIS — Z0000 Encounter for general adult medical examination without abnormal findings: Secondary | ICD-10-CM | POA: Diagnosis not present

## 2023-12-25 DIAGNOSIS — N521 Erectile dysfunction due to diseases classified elsewhere: Secondary | ICD-10-CM | POA: Diagnosis not present

## 2023-12-26 ENCOUNTER — Other Ambulatory Visit (HOSPITAL_COMMUNITY): Payer: Self-pay | Admitting: Internal Medicine

## 2023-12-26 DIAGNOSIS — M810 Age-related osteoporosis without current pathological fracture: Secondary | ICD-10-CM

## 2024-01-14 ENCOUNTER — Inpatient Hospital Stay (HOSPITAL_COMMUNITY): Admission: RE | Admit: 2024-01-14 | Source: Ambulatory Visit

## 2024-01-15 ENCOUNTER — Other Ambulatory Visit: Payer: Medicare (Managed Care)

## 2024-01-15 DIAGNOSIS — Z8546 Personal history of malignant neoplasm of prostate: Secondary | ICD-10-CM | POA: Diagnosis not present

## 2024-01-16 ENCOUNTER — Ambulatory Visit: Payer: Self-pay

## 2024-01-16 LAB — PSA: Prostate Specific Ag, Serum: 0.3 ng/mL (ref 0.0–4.0)

## 2024-01-16 NOTE — Telephone Encounter (Signed)
 Pt called to get PSA results. Pt was given results per MD Dahlstedts review. ( See previous encounter for details ) pt voiced his understanding

## 2024-01-16 NOTE — Telephone Encounter (Signed)
-----   Message from Garnette HERO Dahlstedt sent at 01/16/2024  8:53 AM EDT ----- Let patient know that his PSA is 0.3, stable. ----- Message ----- From: Interface, [facility] Lab Results In Sent: 01/16/2024   5:37 AM EDT To: Garnette Shack, MD

## 2024-01-16 NOTE — Telephone Encounter (Signed)
 Called pt to give results pt did not answer lvm to c/b for results

## 2024-03-27 DIAGNOSIS — H3550 Unspecified hereditary retinal dystrophy: Secondary | ICD-10-CM | POA: Diagnosis not present

## 2024-03-27 DIAGNOSIS — H52221 Regular astigmatism, right eye: Secondary | ICD-10-CM | POA: Diagnosis not present

## 2024-03-27 DIAGNOSIS — H524 Presbyopia: Secondary | ICD-10-CM | POA: Diagnosis not present

## 2024-03-27 DIAGNOSIS — H5203 Hypermetropia, bilateral: Secondary | ICD-10-CM | POA: Diagnosis not present

## 2024-04-29 DIAGNOSIS — Z0000 Encounter for general adult medical examination without abnormal findings: Secondary | ICD-10-CM | POA: Diagnosis not present

## 2024-04-29 DIAGNOSIS — E7849 Other hyperlipidemia: Secondary | ICD-10-CM | POA: Diagnosis not present

## 2024-04-29 DIAGNOSIS — I10 Essential (primary) hypertension: Secondary | ICD-10-CM | POA: Diagnosis not present

## 2024-04-29 DIAGNOSIS — N521 Erectile dysfunction due to diseases classified elsewhere: Secondary | ICD-10-CM | POA: Diagnosis not present

## 2024-04-29 DIAGNOSIS — Z6827 Body mass index (BMI) 27.0-27.9, adult: Secondary | ICD-10-CM | POA: Diagnosis not present

## 2024-04-29 DIAGNOSIS — N182 Chronic kidney disease, stage 2 (mild): Secondary | ICD-10-CM | POA: Diagnosis not present

## 2024-04-29 DIAGNOSIS — J3089 Other allergic rhinitis: Secondary | ICD-10-CM | POA: Diagnosis not present

## 2024-04-29 DIAGNOSIS — Z8546 Personal history of malignant neoplasm of prostate: Secondary | ICD-10-CM | POA: Diagnosis not present

## 2024-07-21 NOTE — Progress Notes (Unsigned)
 "   Impression/Assessment:  -Grade group 2, high-volume prostate cancer, status post seed placement mid 2022.  Continued excellent PSA response, no complications  -ED, treated with daily tadalafil  -BPH, doing well with alfuzosin   Plan: - Continue alfuzosin , daily tadalafil  - PSA is checked today  - I will have him come back in 1 year here to recheck   History of Present Illness:   Here for follow-up of prostate cancer.  TRUS/Bx on 3.22.2022. PSA 6  Prostate volume 31 mL, PSAD 0.19. 5 cores revealed GG2 PCA- Lt apex lateral (5% involvement), Rt apex lateral (70%), Lt apex medial (5%), Lt mid lateral (20%), Lt mid medial (5%)   7.21.2022: I-125 brachytherapy and placement of SpaceOAR   1.7.2025: Routine check.  Since he was last here in April 2024 he denies any real urinary issues.  He was switched from Flomax  to alfuzosin  by his PCP.  He is doing well with that.  No gross hematuria, no dysuria.  Does use tadalafil for ED.  No recent PSA-0.3 back at his last visit.  1.13.2026: His PSA 6 mos ago was 0.3. Current IPSS 14/2.  He denies any difficulty with blood in the urine or stool.  Still on alfuzosin  and daily tadalafil.  Past Medical History:  Diagnosis Date   COVID 07/17/2019   cough x 14 days all symptoms resolved result in care everywhere   Hypercholesteremia    Hypertension    Prostate cancer (HCC)    Wears dentures 01/20/2021   upper   Wears glasses 01/20/2021    Past Surgical History:  Procedure Laterality Date   COLONOSCOPY WITH PROPOFOL  N/A 05/24/2020   Procedure: COLONOSCOPY WITH PROPOFOL ;  Surgeon: Cindie Carlin POUR, DO;  Location: [facility];  Service: Endoscopy;  Laterality: N/A;  Surveillance-10:45am   CYSTOSCOPY N/A 01/27/2021   Procedure: CYSTOSCOPY FLEXIBLE;  Surgeon: Matilda Senior, MD;  Location: [facility];  Service: Urology;  Laterality: N/A;  NO SEEDS FOUND IN BLADDER   POLYPECTOMY  05/24/2020   Procedure: POLYPECTOMY;   Surgeon: Cindie Carlin POUR, DO;  Location: [facility];  Service: Endoscopy;;   RADIOACTIVE SEED IMPLANT N/A 01/27/2021   Procedure: RADIOACTIVE SEED IMPLANT/BRACHYTHERAPY IMPLANT;  Surgeon: Matilda Senior, MD;  Location: [facility];  Service: Urology;  Laterality: N/A;  64  SEEDS IMPLANTED   SPACE OAR INSTILLATION N/A 01/27/2021   Procedure: SPACE OAR INSTILLATION;  Surgeon: Matilda Senior, MD;  Location: [facility];  Service: Urology;  Laterality: N/A;    Home Medications:  Allergies as of 07/22/2024   No Known Allergies      Medication List        Accurate as of July 21, 2024  7:24 AM. If you have any questions, ask your nurse or doctor.          alfuzosin  10 MG 24 hr tablet Commonly known as: UROXATRAL  Take 1 tablet (10 mg total) by mouth daily with breakfast.   amLODipine 10 MG tablet Commonly known as: NORVASC Take 10 mg by mouth daily.   atorvastatin 40 MG tablet Commonly known as: LIPITOR Take 40 mg by mouth daily.   Claritin 10 MG Caps Generic drug: Loratadine Take 10 mg by mouth as needed.   montelukast 10 MG tablet Commonly known as: SINGULAIR Take 10 mg by mouth daily as needed.   tadalafil 5 MG tablet Commonly known as: CIALIS Take 5 mg by mouth daily as needed for erectile dysfunction.  Allergies: No Known Allergies  Family History  Problem Relation Age of Onset   Prostate cancer Brother    Breast cancer Neg Hx    Colon cancer Neg Hx    Pancreatic cancer Neg Hx     Social History:  reports that he has quit smoking. His smoking use included cigarettes. He has a 3.5 pack-year smoking history. He has never used smokeless tobacco. He reports that he does not drink alcohol and does not use drugs.  ROS: A complete review of systems was performed.  All systems are negative except for pertinent findings as noted.  Physical Exam:  Vital signs in last 24 hours: There were no vitals taken for this  visit. Constitutional:  Alert and oriented, No acute distress Cardiovascular: Regular rate  Respiratory: Normal respiratory effort Neurologic: Grossly intact, no focal deficits Psychiatric: Normal mood and affect  I have reviewed prior pt notes  I have reviewed urinalysis results--clear today  I have independently reviewed prior imaging--TRUS data  I have reviewed both pathology and PSA results  IPSS reviewed     "

## 2024-07-22 ENCOUNTER — Ambulatory Visit: Payer: Medicare Other | Admitting: Urology

## 2024-07-22 ENCOUNTER — Ambulatory Visit: Payer: Medicare (Managed Care) | Admitting: Urology

## 2024-07-22 ENCOUNTER — Other Ambulatory Visit: Payer: Self-pay

## 2024-07-22 VITALS — BP 144/72 | HR 101

## 2024-07-22 DIAGNOSIS — Z8546 Personal history of malignant neoplasm of prostate: Secondary | ICD-10-CM

## 2024-07-22 DIAGNOSIS — N138 Other obstructive and reflux uropathy: Secondary | ICD-10-CM

## 2024-07-22 DIAGNOSIS — N400 Enlarged prostate without lower urinary tract symptoms: Secondary | ICD-10-CM | POA: Diagnosis not present

## 2024-07-22 LAB — URINALYSIS, ROUTINE W REFLEX MICROSCOPIC
Bilirubin, UA: NEGATIVE
Glucose, UA: NEGATIVE
Ketones, UA: NEGATIVE
Leukocytes,UA: NEGATIVE
Nitrite, UA: NEGATIVE
Protein,UA: NEGATIVE
RBC, UA: NEGATIVE
Specific Gravity, UA: 1.015 (ref 1.005–1.030)
Urobilinogen, Ur: 0.2 mg/dL (ref 0.2–1.0)
pH, UA: 6 (ref 5.0–7.5)

## 2024-07-22 MED ORDER — ALFUZOSIN HCL ER 10 MG PO TB24: 10.0000 mg | ORAL_TABLET | Freq: Every day | ORAL | 3 refills | Status: AC

## 2024-07-23 ENCOUNTER — Ambulatory Visit: Payer: Self-pay

## 2024-07-23 LAB — PSA: Prostate Specific Ag, Serum: 0.1 ng/mL (ref 0.0–4.0)

## 2024-07-23 NOTE — Telephone Encounter (Signed)
 Tried calling pt with no answer. LVM for return call

## 2024-07-23 NOTE — Telephone Encounter (Signed)
 Pt made aware and voiced understanding.

## 2024-07-23 NOTE — Telephone Encounter (Signed)
-----   Message from Garnette Shack, MD sent at 07/23/2024  8:37 AM EST ----- Let pt know that PSA now 0.1--good news

## 2025-07-27 ENCOUNTER — Ambulatory Visit: Payer: Medicare (Managed Care) | Admitting: Urology
